# Patient Record
Sex: Male | Born: 1985 | Race: Black or African American | Hispanic: No | Marital: Single | Smoking: Never smoker
Health system: Southern US, Community
[De-identification: ages and names within clinical notes are randomized; demographics above are authoritative.]

## PROBLEM LIST (undated history)

## (undated) DIAGNOSIS — G43909 Migraine, unspecified, not intractable, without status migrainosus: Secondary | ICD-10-CM

## (undated) HISTORY — PX: TONSILLECTOMY: SUR1361

---
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
## 2007-05-05 ENCOUNTER — Emergency Department (HOSPITAL_COMMUNITY): Admission: EM | Admit: 2007-05-05 | Discharge: 2007-05-05 | Payer: Self-pay | Admitting: Emergency Medicine

## 2010-11-18 NOTE — H&P (Signed)
Clayton Heath, DENKER             ACCOUNT NO.:  [id number]   MEDICAL RECORD NO.:  [id number]          PATIENT TYPE:  AMB   LOCATION:  DAY                           FACILITY:  [facility]   PHYSICIAN:  Vickki Hearing, M.D.DATE OF BIRTH:  06/16/1966   DATE OF ADMISSION:  DATE OF DISCHARGE:  LH                              HISTORY & PHYSICAL   CHIEF COMPLAINT:  Torn right Achilles tendon.   HISTORY:  A 25 year old male with a history of testicular cancer,  treated successfully with right oophorectomy and chemotherapy, presents  status post left Achilles tendon repair now with a right Achilles tendon  repair.  He was injured on October 03, 2007.  He simply bent over to pick  up a ball and the tendon popped.  He heard a loud sound, pain, burning  in his right Achilles.  This was similar to his other injury for which  he underwent repair successfully.  He presents for surgical treatment of  the right Achilles tear.   He has no other medical problems.   No family history, except for a family history of cancer.   He is married.  He does smoke, drinks occasionally.  He has a high  school diploma.   REVIEW OF SYSTEMS:  Only positive for testicular cancer.   He has also had lumbar disk surgery.  He had the Achilles surgery as  stated.  He had an oophorectomy as stated.   PHYSICAL EXAMINATION:  VITAL SIGNS:  Stable.  Weight is 161 pounds.  APPEARANCE:  Normal.  CARDIOVASCULAR:  Intact.  SKIN:  Warm and dry with no rashes.  LYMPH NODES:  Normal.  PSYCH:  Normal.  He is awake, alert, and oriented x3.  NEUROLOGIC:  Normal.  MUSCULOSKELETAL:  Findings were a positive Thompson test for rupture of  the right Achilles tendon.  There is swelling there with palpable gap  and tenderness.  Passive range of motion is painful as well.  His other  extremities on inspection are normal.  There are no contractures.  No  instability.  Normal muscle tone is noted.   IMPRESSION:  Right Achilles tendon  rupture.   PLAN:  Right Achilles tendon repair.      Vickki Hearing, M.D.  Electronically Signed     SEH/MEDQ  D:  10/24/2007  T:  10/24/2007  Job:  [id number]   cc:   [facility]  Fax: [phone]

## 2011-04-11 LAB — POCT CARDIAC MARKERS
CKMB, poc: <1 — ABNORMAL LOW
CKMB, poc: <1 — ABNORMAL LOW
Myoglobin, poc: 47.9
Myoglobin, poc: 56
Operator id: 166561
Operator id: 166561
Troponin i, poc: <0.05
Troponin i, poc: <0.05

## 2011-12-05 ENCOUNTER — Encounter (HOSPITAL_COMMUNITY): Payer: Self-pay | Admitting: *Deleted

## 2011-12-05 ENCOUNTER — Emergency Department (HOSPITAL_COMMUNITY)
Admission: EM | Admit: 2011-12-05 | Discharge: 2011-12-05 | Disposition: A | Discharge status: Home / Self Care | Payer: BC Managed Care – PPO | Attending: Emergency Medicine | Admitting: Emergency Medicine

## 2011-12-05 DIAGNOSIS — S61411A Laceration without foreign body of right hand, initial encounter: Secondary | ICD-10-CM

## 2011-12-05 DIAGNOSIS — S61409A Unspecified open wound of unspecified hand, initial encounter: Secondary | ICD-10-CM | POA: Insufficient documentation

## 2011-12-05 DIAGNOSIS — W268XXA Contact with other sharp object(s), not elsewhere classified, initial encounter: Secondary | ICD-10-CM | POA: Insufficient documentation

## 2011-12-05 MED ORDER — HYDROCODONE-ACETAMINOPHEN 5-325 MG PO TABS: ORAL_TABLET | ORAL | Status: DC

## 2011-12-05 MED ORDER — TETANUS-DIPHTH-ACELL PERTUSSIS 5-2.5-18.5 LF-MCG/0.5 IM SUSP
0.5000 mL | Freq: Once | INTRAMUSCULAR | Status: AC
  Administered 2011-12-05: 0.5 mL via INTRAMUSCULAR
  Filled 2011-12-05: qty 0.5

## 2011-12-05 MED ORDER — LIDOCAINE HCL (PF) 1 % IJ SOLN
INTRAMUSCULAR | Status: AC
  Filled 2011-12-05: qty 5

## 2011-12-05 MED ORDER — TETANUS-DIPHTH-ACELL PERTUSSIS 5-2.5-18.5 LF-MCG/0.5 IM SUSP: 0.5000 mL | Freq: Once | INTRAMUSCULAR | Status: DC

## 2011-12-05 NOTE — ED Provider Notes (Signed)
Medical screening examination/treatment/procedure(s) were performed by non-physician practitioner and as supervising physician I was immediately available for consultation/collaboration.   Laray Anger, DO 12/05/11 1927

## 2011-12-05 NOTE — ED Notes (Signed)
Pt has 3 inch laceration to his right hand. States that he was opening a can and his hand slipped and he cut it on the lid. Bleeding controlled at present. Able to wiggle fingers and make a fist with no difficulty.

## 2011-12-05 NOTE — ED Notes (Signed)
Pt states that he is feeling better now. Remains diaphoretic but more responsive since being placed on stretcher lying down.

## 2011-12-05 NOTE — Discharge Instructions (Signed)
Sutured Wound Care Sutures are stitches that can be used to close wounds. Wound care helps prevent pain and infection.  HOME CARE INSTRUCTIONS   Rest and elevate the injured area until all the pain and swelling are gone.   Only take over-the-counter or prescription medicines for pain, discomfort, or fever as directed by your caregiver.   After 48 hours, gently wash the area with mild soap and water once a day, or as directed. Rinse off the soap. Pat the area dry with a clean towel. Do not rub the wound. This may cause bleeding.   Follow your caregiver's instructions for how often to change the bandage (dressing). Stop using a dressing after 2 days or after the wound stops draining.   If the dressing sticks, moisten it with soapy water and gently remove it.   Apply ointment on the wound as directed.   Avoid stretching a sutured wound.   Drink enough fluids to keep your urine clear or pale yellow.   Follow up with your caregiver for suture removal as directed.   Use sunscreen on your wound for the next 3 to 6 months so the scar will not darken.  SEEK IMMEDIATE MEDICAL CARE IF:   Your wound becomes red, swollen, hot, or tender.   You have increasing pain in the wound.   You have a red streak that extends from the wound.   There is pus coming from the wound.   You have a fever.   You have shaking chills.   There is a bad smell coming from the wound.   You have persistent bleeding from the wound.  MAKE SURE YOU:   Understand these instructions.   Will watch your condition.   Will get help right away if you are not doing well or get worse.  Document Released: 07/27/2004 Document Revised: 06/08/2011 Document Reviewed: 10/23/2010 [employer] Patient Information 2012 [employer].   Wash wound twice daily with soap and water then apply antibiotic ointment.  Suture removal in 8-10 days. 

## 2011-12-05 NOTE — ED Notes (Signed)
Reports last tetnus injection was in 2009.

## 2011-12-05 NOTE — ED Notes (Signed)
Pt extremely diaphoretic and feeling like he is going to pass out. Pt placed on stretcher.

## 2011-12-05 NOTE — ED Notes (Signed)
Unwrapped dressing - cleaned wound with wound cleanser.  Bleeding controlled.  Pt has approx 4cm in length.  nad noted.

## 2011-12-05 NOTE — ED Provider Notes (Signed)
History     CSN: [id number]  Arrival date & time 12/05/11  1320   First MD Initiated Contact with Patient 12/05/11 1423      Chief Complaint  Patient presents with  . Laceration    (Consider location/radiation/quality/duration/timing/severity/associated sxs/prior treatment) HPI Comments: Opening a can of green beans and cut R hand  Patient is a 26 y.o. male presenting with skin laceration. The history is provided by the patient. No language interpreter was used.  Laceration  The incident occurred 1 to 2 hours ago. The laceration is located on the right hand. Size: 4.5 cm. Injury mechanism: can lid. The pain is at a severity of 7/10. The pain has been constant since onset. He reports no foreign bodies present. His tetanus status is unknown.    History reviewed. No pertinent past medical history.  History reviewed. No pertinent past surgical history.  History reviewed. No pertinent family history.  History  Substance Use Topics  . Smoking status: Never Smoker   . Smokeless tobacco: Not on file  . Alcohol Use: No      Review of Systems  Skin: Positive for wound.  Neurological: Negative for weakness and numbness.  All other systems reviewed and are negative.    Allergies  Review of patient's allergies indicates no known allergies.  Home Medications   Current Outpatient Rx  Name Route Sig Dispense Refill  . B COMPLEX PO TABS Oral Take 1 tablet by mouth daily.    Marland Kitchen HYDROCODONE-ACETAMINOPHEN 5-325 MG PO TABS  One tab po q 4-6 hrs prn pain 20 tablet 0    BP 119/62  Pulse 73  Temp(Src) 98.1 F (36.7 C) (Oral)  Resp 18  Ht 5\' 6"  (1.676 m)  Wt 231 lb (104.781 kg)  BMI 37.28 kg/m2  SpO2 98%  Physical Exam  Nursing note and vitals reviewed. Constitutional: He is oriented to person, place, and time. He appears well-developed and well-nourished.  HENT:  Head: Normocephalic and atraumatic.  Eyes: EOM are normal.  Neck: Normal range of motion.  Cardiovascular:  Normal rate, regular rhythm, normal heart sounds and intact distal pulses.   Pulmonary/Chest: Effort normal and breath sounds normal. No respiratory distress.  Abdominal: Soft. He exhibits no distension. There is no tenderness.  Musculoskeletal: He exhibits tenderness.       Right hand: He exhibits decreased range of motion, tenderness and laceration. He exhibits no bony tenderness, normal capillary refill, no deformity and no swelling. normal sensation noted. Normal strength noted.       Hands: Neurological: He is alert and oriented to person, place, and time.  Skin: Skin is warm and dry.  Psychiatric: He has a normal mood and affect. Judgment normal.    ED Course  LACERATION REPAIR Date/Time: 12/05/2011 3:00 PM Performed by: Worthy Rancher Authorized by: Worthy Rancher Consent: Verbal consent obtained. Written consent not obtained. Risks and benefits: risks, benefits and alternatives were discussed Consent given by: patient Patient understanding: patient states understanding of the procedure being performed Site marked: the operative site was not marked Imaging studies: imaging studies not available Required items: required blood products, implants, devices, and special equipment available Patient identity confirmed: verbally with patient Time out: Immediately prior to procedure a "time out" was called to verify the correct patient, procedure, equipment, support staff and site/side marked as required. Body area: upper extremity Location details: right hand Laceration length: 4.5 cm Foreign bodies: no foreign bodies Tendon involvement: none Nerve involvement: none Vascular damage: no Anesthesia:  local infiltration Local anesthetic: lidocaine 1% without epinephrine Anesthetic total: 5 ml Patient sedated: no Preparation: Patient was prepped and draped in the usual sterile fashion. Irrigation solution: saline Irrigation method: syringe Amount of cleaning:  extensive Debridement: none Degree of undermining: none Skin closure: 4-0 nylon Number of sutures: 9 Technique: simple Approximation: close Approximation difficulty: simple Dressing: 4x4 sterile gauze Patient tolerance: Patient tolerated the procedure well with no immediate complications.   (including critical care time)  Labs Reviewed - No data to display No results found.   1. Hand laceration, right, initial encounter       MDM  Wash wound twice daily with soap and water.  Suture removal in 8-10 days.  DT given.        Worthy Rancher, PA 12/05/11 1537

## 2011-12-15 ENCOUNTER — Encounter (HOSPITAL_COMMUNITY): Payer: Self-pay | Admitting: *Deleted

## 2011-12-15 ENCOUNTER — Emergency Department (HOSPITAL_COMMUNITY)
Admission: EM | Admit: 2011-12-15 | Discharge: 2011-12-15 | Disposition: A | Discharge status: Home / Self Care | Payer: BC Managed Care – PPO | Attending: Emergency Medicine | Admitting: Emergency Medicine

## 2011-12-15 DIAGNOSIS — Z5189 Encounter for other specified aftercare: Secondary | ICD-10-CM

## 2011-12-15 NOTE — ED Notes (Signed)
Seen by Dr. Patria Mane and stitches removed in waiting area.

## 2011-12-15 NOTE — ED Provider Notes (Signed)
History   This chart was scribed for Lyanne Co, MD by Sofie Rower. The patient was seen in room APRP/RP and the patient's care was started at 1:17 PM    CSN: [id number]  Arrival date & time 12/15/11  1303   First MD Initiated Contact with Patient 12/15/11 1316      Chief Complaint  Patient presents with  . Wound Check    HPI  Clayton Heath is a 26 y.o. male who presents to the Emergency Department complaining of moderate, episodic wound check located on the right palm of right hand onset today. The pt states "he cut his hand last Tuesday and it is still sore." Pt has a hx of right hand laceration, 12/05/11, where which 9 sutures were applied to the right palm.   Pt denies fever, redness, hx of surgery.       History  Substance Use Topics  . Smoking status: Never Smoker   . Smokeless tobacco: Not on file  . Alcohol Use: No      Review of Systems  All other systems reviewed and are negative.    10 Systems reviewed and all are negative for acute change except as noted in the HPI.    Allergies  Review of patient's allergies indicates no known allergies.  Home Medications   Current Outpatient Rx  Name Route Sig Dispense Refill  . B COMPLEX PO TABS Oral Take 1 tablet by mouth daily.    Marland Kitchen HYDROCODONE-ACETAMINOPHEN 5-325 MG PO TABS  One tab po q 4-6 hrs prn pain 20 tablet 0    BP 133/77  Pulse 80  Temp 98 F (36.7 C)  Resp 20  Ht 5\' 6"  (1.676 m)  Wt 238 lb (107.956 kg)  BMI 38.41 kg/m2  SpO2 99%  Physical Exam  Nursing note and vitals reviewed. Constitutional: He is oriented to person, place, and time. He appears well-developed and well-nourished.  HENT:  Head: Atraumatic.  Right Ear: External ear normal.  Left Ear: External ear normal.  Nose: Nose normal.  Eyes: EOM are normal. Pupils are equal, round, and reactive to light. Right eye exhibits no discharge. Left eye exhibits no discharge.  Neck: Normal range of motion.  Cardiovascular: Normal  rate.   Pulmonary/Chest: Effort normal.  Musculoskeletal: Normal range of motion.       Suture line, no erythema, no fluctuance, intact right radial surface of palm, normal flexion of right thumb.   Neurological: He is alert and oriented to person, place, and time.  Skin: Skin is warm and dry. No erythema.    ED Course  Procedures (including critical care time)  DIAGNOSTIC STUDIES: Oxygen Saturation is 99% on room air, normal by my interpretation.    COORDINATION OF CARE:    1:21PM- EDP at bedside removes 3 out of 9 stitches, wound began to dehisce.  1:22PM- EDP at bedside discusses treatment plan concerning leaving sutures in for a while longer (4 more days, return on 12/19/11) .    Labs Reviewed - No data to display No results found.   1. Visit for wound check       MDM  When I attempted to remove the sutures at began to be a small amount of wound dehiscent.  3 sutures were removed.  Is obvious at this time the sutures need to be on for longer.  The patient will return in 4 days.  No signs of infection.  Steri-Strips applied      I personally  performed the services described in this documentation, which was scribed in my presence. The recorded information has been reviewed and considered.         Lyanne Co, MD 12/15/11 1330

## 2011-12-15 NOTE — ED Notes (Signed)
Here for suture removal to rt hand. Wound checked by Md at triage.  Alert, NAD

## 2012-07-20 ENCOUNTER — Emergency Department (HOSPITAL_COMMUNITY)
Admission: EM | Admit: 2012-07-20 | Discharge: 2012-07-20 | Disposition: A | Discharge status: Home / Self Care | Payer: BC Managed Care – PPO | Attending: Emergency Medicine | Admitting: Emergency Medicine

## 2012-07-20 ENCOUNTER — Encounter (HOSPITAL_COMMUNITY): Payer: Self-pay | Admitting: *Deleted

## 2012-07-20 DIAGNOSIS — Y929 Unspecified place or not applicable: Secondary | ICD-10-CM | POA: Insufficient documentation

## 2012-07-20 DIAGNOSIS — Y939 Activity, unspecified: Secondary | ICD-10-CM | POA: Insufficient documentation

## 2012-07-20 DIAGNOSIS — W268XXA Contact with other sharp object(s), not elsewhere classified, initial encounter: Secondary | ICD-10-CM | POA: Insufficient documentation

## 2012-07-20 DIAGNOSIS — S61209A Unspecified open wound of unspecified finger without damage to nail, initial encounter: Secondary | ICD-10-CM | POA: Insufficient documentation

## 2012-07-20 DIAGNOSIS — S61219A Laceration without foreign body of unspecified finger without damage to nail, initial encounter: Secondary | ICD-10-CM

## 2012-07-20 MED ORDER — LIDOCAINE HCL (PF) 1 % IJ SOLN
INTRAMUSCULAR | Status: AC
  Filled 2012-07-20: qty 5

## 2012-07-20 NOTE — ED Notes (Signed)
Laceration to 4th digit of right hand. Cut by a piece of glass. NAD. Tetanus is UTD

## 2012-07-20 NOTE — ED Provider Notes (Signed)
History     CSN: [id number]  Arrival date & time 07/20/12  1650   First MD Initiated Contact with Patient 07/20/12 1710      Chief Complaint  Patient presents with  . Extremity Laceration    (Consider location/radiation/quality/duration/timing/severity/associated sxs/prior treatment) Patient is a 27 y.o. Heath presenting with skin laceration. The history is provided by the patient.  Laceration  The incident occurred 1 to 2 hours ago. The laceration is located on the right hand. The laceration is 2 cm in size. The laceration mechanism was a broken glass. The pain is moderate. He reports no foreign bodies present. His tetanus status is UTD.    History reviewed. No pertinent past medical history.  Past Surgical History  Procedure Date  . Tonsillectomy     No family history on file.  History  Substance Use Topics  . Smoking status: Never Smoker   . Smokeless tobacco: Not on file  . Alcohol Use: No      Review of Systems  Constitutional: Negative for activity change.       All ROS Neg except as noted in HPI  HENT: Negative for nosebleeds and neck pain.   Eyes: Negative for photophobia and discharge.  Respiratory: Negative for cough, shortness of breath and wheezing.   Cardiovascular: Negative for chest pain and palpitations.  Gastrointestinal: Negative for abdominal pain and blood in stool.  Genitourinary: Negative for dysuria, frequency and hematuria.  Musculoskeletal: Negative for back pain and arthralgias.  Skin: Negative.   Neurological: Negative for dizziness, seizures and speech difficulty.  Psychiatric/Behavioral: Negative for hallucinations and confusion.    Allergies  Review of patient's allergies indicates no known allergies.  Home Medications   Current Outpatient Rx  Name  Route  Sig  Dispense  Refill  . B COMPLEX PO TABS   Oral   Take 1 tablet by mouth daily.         Marland Kitchen HYDROCODONE-ACETAMINOPHEN 5-325 MG PO TABS      One tab po q 4-6 hrs prn  pain   20 tablet   0     BP 134/61  Pulse 69  Temp 98.1 F (36.7 C) (Oral)  Resp 16  Ht 5\' 6"  (1.676 m)  Wt 228 lb (103.42 kg)  BMI 36.80 kg/m2  SpO2 100%  Physical Exam  Nursing note and vitals reviewed. Constitutional: He is oriented to person, place, and time. He appears well-developed and well-nourished.  Non-toxic appearance.  HENT:  Head: Normocephalic.  Right Ear: Tympanic membrane and external ear normal.  Left Ear: Tympanic membrane and external ear normal.  Eyes: EOM and lids are normal. Pupils are equal, round, and reactive to light.  Neck: Normal range of motion. Neck supple. Carotid bruit is not present.  Cardiovascular: Normal rate, regular rhythm, normal heart sounds, intact distal pulses and normal pulses.   Pulmonary/Chest: Breath sounds normal. No respiratory distress.  Abdominal: Soft. Bowel sounds are normal. There is no tenderness. There is no guarding.  Musculoskeletal: Normal range of motion.       There is a 2.1 cm laceration of the lateral aspect of the right fourth finger. No bone or tendon involvement. Is full range of motion of all fingers of the right hand. Sensory is intact.  Lymphadenopathy:       Head (right side): No submandibular adenopathy present.       Head (left side): No submandibular adenopathy present.    He has no cervical adenopathy.  Neurological: He is alert  and oriented to person, place, and time. He has normal strength. No cranial nerve deficit or sensory deficit. Coordination normal.       No sensory or motor deficits of the right hand.  Skin: Skin is warm and dry.  Psychiatric: He has a normal mood and affect. His speech is normal.    ED Course  LACERATION REPAIR Date/Time: 07/20/2012 5:40 PM Performed by: Kathie Dike Authorized by: Kathie Dike Consent: Verbal consent obtained. Risks and benefits: risks, benefits and alternatives were discussed Consent given by: patient Patient understanding: patient states  understanding of the procedure being performed Patient identity confirmed: arm band Time out: Immediately prior to procedure a "time out" was called to verify the correct patient, procedure, equipment, support staff and site/side marked as required. Body area: upper extremity Location details: right ring finger Laceration length: 2.1 cm Foreign bodies: no foreign bodies Vascular damage: no Preparation: Patient was prepped and draped in the usual sterile fashion. Irrigation solution: tap water Irrigation method: tap Amount of cleaning: standard (safe clens) Skin closure: glue Approximation: close Approximation difficulty: simple Patient tolerance: Patient tolerated the procedure well with no immediate complications.   (including critical care time)  Labs Reviewed - No data to display No results found.   No diagnosis found.    MDM  I have reviewed nursing notes, vital signs, and all appropriate lab and imaging results for this patient. Patient sustained a laceration to the right fourth finger on a piece of glass. The patient's tetanus is up-to-date. There is no tendon or nerve or vascular involvement. The wound was repaired with Dermabond without problem or complication. The patient has been given instructions on Dermabond and instructions on signs of infection. The patient is to return if any changes or problems.      Kathie Dike, [place] 07/20/12 1743

## 2012-07-20 NOTE — ED Provider Notes (Signed)
Medical screening examination/treatment/procedure(s) were performed by non-physician practitioner and as supervising physician I was immediately available for consultation/collaboration.  Joretta Eads, MD 07/20/12 2240 

## 2013-08-25 ENCOUNTER — Encounter (HOSPITAL_COMMUNITY): Payer: Self-pay | Admitting: Emergency Medicine

## 2013-08-25 ENCOUNTER — Emergency Department (HOSPITAL_COMMUNITY)
Admission: EM | Admit: 2013-08-25 | Discharge: 2013-08-25 | Disposition: A | Discharge status: Home / Self Care | Payer: BC Managed Care – PPO | Attending: Emergency Medicine | Admitting: Emergency Medicine

## 2013-08-25 DIAGNOSIS — R51 Headache: Secondary | ICD-10-CM | POA: Insufficient documentation

## 2013-08-25 DIAGNOSIS — R519 Headache, unspecified: Secondary | ICD-10-CM

## 2013-08-25 MED ORDER — METOCLOPRAMIDE HCL 5 MG/ML IJ SOLN
10.0000 mg | Freq: Once | INTRAMUSCULAR | Status: AC
  Administered 2013-08-25: 10 mg via INTRAVENOUS
  Filled 2013-08-25: qty 2

## 2013-08-25 MED ORDER — DIPHENHYDRAMINE HCL 50 MG/ML IJ SOLN
25.0000 mg | Freq: Once | INTRAMUSCULAR | Status: AC
  Administered 2013-08-25: 25 mg via INTRAVENOUS
  Filled 2013-08-25: qty 1

## 2013-08-25 NOTE — Discharge Instructions (Signed)

## 2013-08-25 NOTE — ED Notes (Signed)
Pt c/o ha today with nausea/dizziness. Pt mother states pt has been having bad headaches recently.

## 2013-08-25 NOTE — ED Notes (Signed)
Patient with no complaints at this time. Respirations even and unlabored. Skin warm/dry. Discharge instructions reviewed with patient at this time. Patient given opportunity to voice concerns/ask questions. IV removed per policy and band-aid applied to site. Patient discharged at this time and left Emergency Department with steady gait.  

## 2013-08-25 NOTE — ED Provider Notes (Signed)
CSN: [id number]     Arrival date & time 08/25/13  1523 History   This chart was scribed for Clayton Gaskins, MD by Manuela Schwartz, ED scribe. This patient was seen in room APA19/APA19 and the patient's care was started at 4:45 PM .    Chief Complaint  Patient presents with  . Headache      HPI HPI Comments: Clayton Heath is a 28 y.o. male who presents to the Emergency Department complaining of moderate to severe HA beginning approximately 3.5 hours ago gradually worsening over ten minutes. Pt reports the HA similar to one he experienced a week ago. He rates the maximum pain he felt during the HA as 10/10, and he currently describes the pain as 8/10. Pt heard a "slight buzzing" during the HA. Pt denies nausea, vomiting, vision loss, weakness in arms and legs, and fever. He also denies h/o strokes, trauma, syncope, CO exposure. He also reports his mother has a h/o migraines.    PMH - none  Past Surgical History  Procedure Laterality Date  . Tonsillectomy     No family history on file. History  Substance Use Topics  . Smoking status: Never Smoker   . Smokeless tobacco: Not on file  . Alcohol Use: No    Review of Systems  Constitutional: Negative for fever and chills.  Eyes: Negative for visual disturbance.  Respiratory: Negative for cough and shortness of breath.   Cardiovascular: Negative for chest pain.  Gastrointestinal: Negative for nausea, vomiting and abdominal pain.  Musculoskeletal: Negative for back pain.  Neurological: Positive for headaches. Negative for weakness.  All other systems reviewed and are negative.      Allergies  Review of patient's allergies indicates no known allergies.  Home Medications   Current Outpatient Rx  Name  Route  Sig  Dispense  Refill  . b complex vitamins tablet   Oral   Take 1 tablet by mouth daily.          Triage Vitals: BP 143/75  Pulse 79  Temp(Src) 98.3 F (36.8 C) (Oral)  Resp 18  Ht 5\' 7"  (1.702 m)  Wt 224 lb  (101.606 kg)  BMI 35.08 kg/m2  SpO2 100%  Physical Exam CONSTITUTIONAL: Well developed/well nourished HEAD: Normocephalic/atraumatic EYES: EOMI/PERRL, no nystagmus ENMT: Mucous membranes moist NECK: supple no meningeal signs, no bruits SPINE:entire spine nontender CV: S1/S2 noted, no murmurs/rubs/gallops noted LUNGS: Lungs are clear to auscultation bilaterally, no apparent distress ABDOMEN: soft, nontender, no rebound or guarding GU:no cva tenderness NEURO:Awake/alert, facies symmetric, no arm or leg drift is noted Cranial nerves 3/4/5/6/01/08/09/11/12 tested and intact Gait normal without ataxia No past pointing No focal sensory deficits in his arms/legs at this time EXTREMITIES: pulses normal, full ROM SKIN: warm, color normal PSYCH: no abnormalities of mood noted    ED Course  Procedures   DIAGNOSTIC STUDIES: Oxygen Saturation is 100% on room air, normal by my interpretation.    COORDINATION OF CARE: At 4:49 PM Discussed treatment plan with patient which includes IV fluids and pain medication. Patient agrees.    Pt improved, well appearing, no distress, no focal neuro deficits, low suspicion for [facility] or other acute neurologic event (resting comfortably in the room) We discussed strict return precauitons  MDM   Final diagnoses:  Headache    Nursing notes including past medical history and social history reviewed and considered in documentation  I personally performed the services described in this documentation, which was scribed in my presence. The  recorded information has been reviewed and is accurate.     Clayton Gaskinsonald W Josepha Barbier, MD 08/25/13 [phone]

## 2013-12-10 ENCOUNTER — Ambulatory Visit (HOSPITAL_BASED_OUTPATIENT_CLINIC_OR_DEPARTMENT_OTHER): Payer: BC Managed Care – PPO | Attending: Otolaryngology | Admitting: Sleep Medicine

## 2013-12-10 VITALS — Ht 66.0 in | Wt 232.0 lb

## 2013-12-10 DIAGNOSIS — R0609 Other forms of dyspnea: Secondary | ICD-10-CM | POA: Insufficient documentation

## 2013-12-10 DIAGNOSIS — G4733 Obstructive sleep apnea (adult) (pediatric): Secondary | ICD-10-CM

## 2013-12-10 DIAGNOSIS — R0989 Other specified symptoms and signs involving the circulatory and respiratory systems: Secondary | ICD-10-CM | POA: Insufficient documentation

## 2013-12-14 DIAGNOSIS — G4733 Obstructive sleep apnea (adult) (pediatric): Secondary | ICD-10-CM

## 2013-12-14 NOTE — Sleep Study (Signed)
   NAME: Clayton Heath DATE OF BIRTH:  Dec 02, 1985 MEDICAL RECORD NUMBER [id number]  LOCATION: [facility]  PHYSICIAN: Harlem Thresher D  DATE OF STUDY: 12/10/2013  SLEEP STUDY TYPE: Nocturnal Polysomnogram               REFERRING PHYSICIAN: Suzanna ObeyByers, John, MD  INDICATION FOR STUDY: Hypersomnia with sleep apnea  EPWORTH SLEEPINESS SCORE:   9/24 HEIGHT: 5\' 6"  (167.6 cm)  WEIGHT: 105.235 kg (232 lb)    Body mass index is 37.46 kg/(m^2).  NECK SIZE:   16.5 in.  MEDICATIONS: Charted for review  SLEEP ARCHITECTURE: Total sleep time 285.5 minutes with sleep efficiency 72.1%. Stage I was 3%, stage II 51.5%, stage III 19.3%, REM 26.3% of total sleep time. Sleep latency 68 minutes, REM latency 52.5 minutes, awake after sleep onset 16.5 minutes, arousal index 12, bedtime medication: None.  RESPIRATORY DATA: Apnea hypopnea index (AHI) 7.8 per hour. 37 total events scored including 2 obstructive apneas and 35 hypopneas. All events were associated with supine sleep position. REM AHI 24.8 per hour. There were not enough  early events to permit application of split protocol CPAP titration.  OXYGEN DATA: Loud snoring with oxygen desaturation nadir of 87% and mean oxygen saturation through the study of 95.3% on room air.  CARDIAC DATA: Normal sinus rhythm  MOVEMENT/PARASOMNIA: A few incidental limb jerks were noted with little effect on sleep. No bathroom trips.  IMPRESSION/ RECOMMENDATION:   1) Mild obstructive sleep apnea/hypopnea syndrome, AHI 7.8 per hour with supine events. Loud snoring with oxygen desaturation to a nadir of 87% and mean oxygen saturation through the study of 95.3% on room air. 2) There were not enough early events to meet protocol requirements for protocol CPAP titration on this study 3) Incidental observation-the patient reported a bitter taste in his mouth upon awakening. Consider possibility of reflux.  Signed Jetty Duhamellinton Darcy Barbara M.D. Waymon BudgeYOUNG,Americus Perkey  D Diplomate, American Board of Sleep Medicine  ELECTRONICALLY SIGNED ON:  12/14/2013, 8:22 AM [facility] PH: [phone]   FX: [phone] ACCREDITED BY THE AMERICAN ACADEMY OF SLEEP MEDICINE

## 2014-01-06 ENCOUNTER — Ambulatory Visit (HOSPITAL_BASED_OUTPATIENT_CLINIC_OR_DEPARTMENT_OTHER): Payer: BC Managed Care – PPO

## 2014-05-10 ENCOUNTER — Encounter (HOSPITAL_COMMUNITY): Payer: Self-pay | Admitting: Emergency Medicine

## 2014-05-10 ENCOUNTER — Emergency Department (HOSPITAL_COMMUNITY)
Admission: EM | Admit: 2014-05-10 | Discharge: 2014-05-10 | Disposition: A | Discharge status: Home / Self Care | Payer: BC Managed Care – PPO | Attending: Emergency Medicine | Admitting: Emergency Medicine

## 2014-05-10 DIAGNOSIS — G43009 Migraine without aura, not intractable, without status migrainosus: Secondary | ICD-10-CM | POA: Insufficient documentation

## 2014-05-10 DIAGNOSIS — Z79899 Other long term (current) drug therapy: Secondary | ICD-10-CM | POA: Insufficient documentation

## 2014-05-10 HISTORY — DX: Migraine, unspecified, not intractable, without status migrainosus: G43.909

## 2014-05-10 MED ORDER — SODIUM CHLORIDE 0.9 % IV BOLUS (SEPSIS)
500.0000 mL | Freq: Once | INTRAVENOUS | Status: AC
  Administered 2014-05-10: 20:00:00 via INTRAVENOUS

## 2014-05-10 MED ORDER — DIPHENHYDRAMINE HCL 50 MG/ML IJ SOLN
25.0000 mg | Freq: Once | INTRAMUSCULAR | Status: AC
  Administered 2014-05-10: 25 mg via INTRAVENOUS
  Filled 2014-05-10: qty 1

## 2014-05-10 MED ORDER — METOCLOPRAMIDE HCL 5 MG/ML IJ SOLN
10.0000 mg | Freq: Once | INTRAMUSCULAR | Status: AC
  Administered 2014-05-10: 10 mg via INTRAVENOUS
  Filled 2014-05-10: qty 2

## 2014-05-10 NOTE — ED Provider Notes (Signed)
CSN: [id number]     Arrival date & time 05/10/14  1716 History  This chart was scribed for American Expressathan R. Rubin PayorPickering, MD by Evon Slackerrance Branch, ED Scribe. This patient was seen in room APA06/APA06 and the patient's care was started at 6:20 PM.      Chief Complaint  Patient presents with  . Migraine   The history is provided by the patient. No language interpreter was used.   HPI Comments: Tommi EmeryLevaniel K Teem is a 28 y.o. male with PMHx of migraines who presents to the Emergency Department complaining of gradual onset headache that began 1 day ago. He states he has associated photophobia, sound sensitivity, sleep disturbance and nausea. He states that the last time her was in the ED he received a headache cocktail that provided relief. Denies head injury or trauma. Denies any visual disturbance, numbness or weakness.     Past Medical History  Diagnosis Date  . Migraines    Past Surgical History  Procedure Laterality Date  . Tonsillectomy     History reviewed. No pertinent family history. History  Substance Use Topics  . Smoking status: Never Smoker   . Smokeless tobacco: Not on file  . Alcohol Use: No    Review of Systems  Eyes: Positive for photophobia. Negative for visual disturbance.  Gastrointestinal: Positive for nausea.  Neurological: Positive for headaches. Negative for weakness and numbness.  Psychiatric/Behavioral: Positive for sleep disturbance.    Allergies  Review of patient's allergies indicates no known allergies.  Home Medications   Prior to Admission medications   Medication Sig Start Date End Date Taking? Authorizing Provider  Multiple Vitamin (MULTIVITAMIN WITH MINERALS) TABS tablet Take 1 tablet by mouth daily.   Yes Historical Provider, MD   Triage Vitals: BP 137/72 mmHg  Pulse 63  Temp(Src) 98.8 F (37.1 C) (Oral)  Resp 15  Ht 5\' 6"  (1.676 m)  Wt 224 lb (101.606 kg)  BMI 36.17 kg/m2  SpO2 100%  Physical Exam  Constitutional: He is oriented to person,  place, and time. He appears well-developed and well-nourished. No distress.  HENT:  Head: Normocephalic and atraumatic.  Eyes: Conjunctivae and EOM are normal.  Neck: Neck supple. No tracheal deviation present.  Cardiovascular: Normal rate.   Pulmonary/Chest: Effort normal. No respiratory distress.  Musculoskeletal: Normal range of motion.  Neurological: He is alert and oriented to person, place, and time.  Skin: Skin is warm and dry.  Psychiatric: He has a normal mood and affect. His behavior is normal.  Nursing note and vitals reviewed.   ED Course  Procedures (including critical care time) Labs Review Labs Reviewed - No data to display  Imaging Review No results found.   EKG Interpretation None      MDM   Final diagnoses:  Migraine without aura and without status migrainosus, not intractable    patient with headache. History of same. History of migraines.feels better after treatment will be discharged home.   I personally performed the services described in this documentation, which was scribed in my presence. The recorded information has been reviewed and is accurate.       Juliet RudeNathan R. Rubin PayorPickering, MD 05/11/14 [id number]

## 2014-05-10 NOTE — Discharge Instructions (Signed)

## 2014-05-10 NOTE — ED Notes (Signed)
Pt reports headache onset last night at 11pm. Pt reports history of migraines. Pt reports light and sound sensitivity and nausea. nad noted.

## 2014-06-27 ENCOUNTER — Emergency Department (HOSPITAL_COMMUNITY)
Admission: EM | Admit: 2014-06-27 | Discharge: 2014-06-28 | Disposition: A | Discharge status: Home / Self Care | Payer: BC Managed Care – PPO | Attending: Emergency Medicine | Admitting: Emergency Medicine

## 2014-06-27 ENCOUNTER — Emergency Department (HOSPITAL_COMMUNITY): Payer: BC Managed Care – PPO

## 2014-06-27 ENCOUNTER — Encounter (HOSPITAL_COMMUNITY): Payer: Self-pay | Admitting: Emergency Medicine

## 2014-06-27 DIAGNOSIS — R05 Cough: Secondary | ICD-10-CM

## 2014-06-27 DIAGNOSIS — R059 Cough, unspecified: Secondary | ICD-10-CM

## 2014-06-27 DIAGNOSIS — Z8679 Personal history of other diseases of the circulatory system: Secondary | ICD-10-CM | POA: Insufficient documentation

## 2014-06-27 DIAGNOSIS — J40 Bronchitis, not specified as acute or chronic: Secondary | ICD-10-CM | POA: Insufficient documentation

## 2014-06-27 NOTE — ED Provider Notes (Signed)
CSN: [id number]     Arrival date & time 06/27/14  2129 History  This chart was scribed for Loren Raceravid Calliope Delangel, MD by Roxy Cedarhandni Bhalodia, ED Scribe. This patient was seen in room APA19/APA19 and the patient's care was started at 11:23 PM.   Chief Complaint  Patient presents with  . Cough   Patient is a 28 y.o. male presenting with cough. The history is provided by the patient. No language interpreter was used.  Cough Cough characteristics:  Productive Sputum characteristics:  Yellow Severity:  Moderate Timing:  Constant Progression:  Waxing and waning Chronicity:  New Associated symptoms: chills and sore throat   Associated symptoms: no chest pain, no headaches, no rash and no shortness of breath    HPI Comments: Clayton Heath is a 28 y.o. male who presents to the Emergency Department complaining of moderate productive cough with yellow sputum that began 3 days ago. He reports associated fever that lasted for 2 days, but states that he currently does not have a fever. He also reports associated chills and sore throat. Patient states that he had sick contact with a relative that had the flu and one with pneumonia. He states he has taken Muccinex that has provided some relief. No difficulty breathing. No chest pain.  Past Medical History  Diagnosis Date  . Migraines    Past Surgical History  Procedure Laterality Date  . Tonsillectomy     History reviewed. No pertinent family history. History  Substance Use Topics  . Smoking status: Never Smoker   . Smokeless tobacco: Not on file  . Alcohol Use: No   Review of Systems  Constitutional: Positive for chills.  HENT: Positive for congestion and sore throat. Negative for sinus pressure.   Respiratory: Positive for cough. Negative for shortness of breath.   Cardiovascular: Negative for chest pain.  Gastrointestinal: Negative for nausea, vomiting, abdominal pain and diarrhea.  Musculoskeletal: Negative for back pain, neck pain and neck  stiffness.  Skin: Negative for rash and wound.  Neurological: Negative for dizziness, weakness, light-headedness, numbness and headaches.  All other systems reviewed and are negative.  Allergies  Review of patient's allergies indicates no known allergies.  Home Medications   Prior to Admission medications   Medication Sig Start Date End Date Taking? Authorizing Provider  benzonatate (TESSALON) 100 MG capsule Take 1 capsule (100 mg total) by mouth every 8 (eight) hours. 06/28/14   Loren Raceravid Paraskevi Funez, MD  ibuprofen (ADVIL,MOTRIN) 600 MG tablet Take 1 tablet (600 mg total) by mouth every 6 (six) hours as needed. 06/28/14   Loren Raceravid Sharnise Blough, MD  Multiple Vitamin (MULTIVITAMIN WITH MINERALS) TABS tablet Take 1 tablet by mouth daily.    Historical Provider, MD   Triage Vitals; BP 123/94 mmHg  Pulse 80  Temp(Src) 98.7 F (37.1 C)  Resp 16  Ht 5\' 6"  (1.676 m)  Wt 225 lb (102.059 kg)  BMI 36.33 kg/m2  SpO2 100%  Physical Exam  Constitutional: He is oriented to person, place, and time. He appears well-developed and well-nourished. No distress.  HENT:  Head: Normocephalic and atraumatic.  Mouth/Throat: No oropharyngeal exudate.  Oropharynx is mildly erythematous. No tonsillar exudates.  Eyes: Conjunctivae and EOM are normal.  Neck: Neck supple. No tracheal deviation present.  Cardiovascular: Normal rate and regular rhythm.  Exam reveals no gallop and no friction rub.   No murmur heard. Pulmonary/Chest: Effort normal. No respiratory distress. He has no wheezes. He has no rales. He exhibits no tenderness.  Abdominal: Soft. Bowel sounds  are normal. He exhibits no distension and no mass. There is no tenderness. There is no rebound and no guarding.  Musculoskeletal: Normal range of motion. He exhibits no edema or tenderness.   No calf swelling or tenderness.  Neurological: He is alert and oriented to person, place, and time.  Moves all extremities without deficit. Sensation is grossly intact.   Skin: Skin is warm and dry.  Psychiatric: He has a normal mood and affect. His behavior is normal.  Nursing note and vitals reviewed.  ED Course  Procedures (including critical care time)  DIAGNOSTIC STUDIES: Oxygen Saturation is 100% on RA, normal by my interpretation.    COORDINATION OF CARE: 11:24 PM- Discussed plans to obtain diagnostic CXR. Pt advised of plan for treatment and pt agrees.  Labs Review Labs Reviewed - No data to display  Imaging Review Dg Chest 2 View  06/28/2014   CLINICAL DATA:  Cough, rib pain  EXAM: CHEST  2 VIEW  COMPARISON:  05/05/2007  FINDINGS: Normal mediastinum and cardiac silhouette. Normal pulmonary vasculature. No evidence of effusion, infiltrate, or pneumothorax. No acute bony abnormality.  IMPRESSION: Normal chest radiograph.   Electronically Signed   By: Genevive BiStewart  Edmunds M.D.   On: 06/28/2014 00:32     EKG Interpretation None     MDM   Final diagnoses:  Cough  Bronchitis      I personally performed the services described in this documentation, which was scribed in my presence. The recorded information has been reviewed and is accurate.  Patient very well-appearing. Normal vital signs. Lungs are clear. Chest x-ray to rule out pneumonia. Doubt influenza.  No evidence of pneumonia on chest x-ray. We'll treat symptomatically. Patient advised to follow-up with his primary doctor for persistent symptoms. Return precautions given.  Loren Raceravid Landis Cassaro, MD 06/28/14 [phone]

## 2014-06-27 NOTE — ED Notes (Signed)
Patient reports cough that started Thursday. Denies fever. States felt short of breath earlier. Reports productive cough.

## 2014-06-28 MED ORDER — BENZONATATE 100 MG PO CAPS: 100.0000 mg | ORAL_CAPSULE | Freq: Three times a day (TID) | ORAL | Status: AC

## 2014-06-28 MED ORDER — BENZONATATE 100 MG PO CAPS
100.0000 mg | ORAL_CAPSULE | Freq: Once | ORAL | Status: AC
  Administered 2014-06-28: 100 mg via ORAL
  Filled 2014-06-28: qty 1

## 2014-06-28 MED ORDER — IBUPROFEN 400 MG PO TABS
600.0000 mg | ORAL_TABLET | Freq: Once | ORAL | Status: AC
  Administered 2014-06-28: 600 mg via ORAL
  Filled 2014-06-28: qty 2

## 2014-06-28 MED ORDER — IBUPROFEN 600 MG PO TABS: 600.0000 mg | ORAL_TABLET | Freq: Four times a day (QID) | ORAL | Status: AC | PRN

## 2014-06-28 NOTE — ED Notes (Signed)
Pt alert & oriented x4, stable gait. Patient given discharge instructions, paperwork & prescription(s). Patient  instructed to stop at the registration desk to finish any additional paperwork. Patient verbalized understanding. Pt left department w/ no further questions. 

## 2014-06-28 NOTE — Discharge Instructions (Signed)

## 2014-09-28 ENCOUNTER — Encounter (HOSPITAL_COMMUNITY): Payer: Self-pay | Admitting: Emergency Medicine

## 2014-09-28 ENCOUNTER — Emergency Department (HOSPITAL_COMMUNITY)
Admission: EM | Admit: 2014-09-28 | Discharge: 2014-09-28 | Disposition: A | Discharge status: Home / Self Care | Payer: BC Managed Care – PPO | Attending: Emergency Medicine | Admitting: Emergency Medicine

## 2014-09-28 DIAGNOSIS — J029 Acute pharyngitis, unspecified: Secondary | ICD-10-CM | POA: Insufficient documentation

## 2014-09-28 DIAGNOSIS — Z8679 Personal history of other diseases of the circulatory system: Secondary | ICD-10-CM | POA: Insufficient documentation

## 2014-09-28 LAB — RAPID STREP SCREEN (MED CTR MEBANE ONLY): Streptococcus, Group A Screen (Direct): NEGATIVE

## 2014-09-28 MED ORDER — DEXAMETHASONE 4 MG PO TABS
12.0000 mg | ORAL_TABLET | Freq: Once | ORAL | Status: AC
  Administered 2014-09-28: 12 mg via ORAL
  Filled 2014-09-28: qty 3

## 2014-09-28 MED ORDER — IBUPROFEN 400 MG PO TABS
600.0000 mg | ORAL_TABLET | Freq: Once | ORAL | Status: AC
  Administered 2014-09-28: 600 mg via ORAL
  Filled 2014-09-28: qty 2

## 2014-09-28 MED ORDER — OXYCODONE-ACETAMINOPHEN 5-325 MG PO TABS
2.0000 | ORAL_TABLET | Freq: Once | ORAL | Status: AC
  Administered 2014-09-28: 2 via ORAL
  Filled 2014-09-28: qty 2

## 2014-09-28 MED ORDER — TRAMADOL HCL 50 MG PO TABS
50.0000 mg | ORAL_TABLET | Freq: Four times a day (QID) | ORAL | Status: AC | PRN
Start: 2014-09-28 — End: ?

## 2014-09-28 NOTE — ED Provider Notes (Signed)
CSN: [id number]     Arrival date & time 09/28/14  4098 History  This chart was scribed for Raeford Razor, MD by Tonye Royalty, ED Scribe. This patient was seen in room APA12/APA12 and the patient's care was started at 10:22 AM.    Chief Complaint  Patient presents with  . Sore Throat   The history is provided by the patient. No language interpreter was used.    HPI Comments: Clayton Heath is a 29 y.o. male who presents to the Emergency Department complaining of sore throat with onset yesterday. He reports associated chills and cough. He states he used Excedrin for a headache without improvement to his sore throat. He reports his mother has with strep throat. He denies fever.  Past Medical History  Diagnosis Date  . Migraines    Past Surgical History  Procedure Laterality Date  . Tonsillectomy     History reviewed. No pertinent family history. History  Substance Use Topics  . Smoking status: Never Smoker   . Smokeless tobacco: Not on file  . Alcohol Use: No    Review of Systems  Constitutional: Positive for chills. Negative for fever.  HENT: Positive for sore throat.   Respiratory: Positive for cough.   All other systems reviewed and are negative.     Allergies  Review of patient's allergies indicates no known allergies.  Home Medications   Prior to Admission medications   Medication Sig Start Date End Date Taking? Authorizing Provider  aspirin-acetaminophen-caffeine (EXCEDRIN MIGRAINE) [phone] MG per tablet Take 2 tablets by mouth every 6 (six) hours as needed for headache.   Yes Historical Provider, MD  benzonatate (TESSALON) 100 MG capsule Take 1 capsule (100 mg total) by mouth every 8 (eight) hours. Patient not taking: Reported on 09/28/2014 06/28/14   Loren Racer, MD  ibuprofen (ADVIL,MOTRIN) 600 MG tablet Take 1 tablet (600 mg total) by mouth every 6 (six) hours as needed. Patient not taking: Reported on 09/28/2014 06/28/14   Loren Racer, MD   BP  110/70 mmHg  Pulse 83  Temp(Src) 98.8 F (37.1 C) (Oral)  Resp 18  Ht  (1.676 m)  Wt 217 lb (98.431 kg)  BMI 35.04 kg/m2  SpO2 98% Physical Exam  Constitutional: He appears well-developed and well-nourished. No distress.  HENT:  Head: Normocephalic and atraumatic.  Mouth/Throat: Uvula is midline. Posterior oropharyngeal erythema present. No oropharyngeal exudate.  Eyes: Conjunctivae are normal. Right eye exhibits no discharge. Left eye exhibits no discharge.  Neck: Neck supple.  Cardiovascular: Normal rate, regular rhythm and normal heart sounds.  Exam reveals no gallop and no friction rub.   No murmur heard. Pulmonary/Chest: Effort normal and breath sounds normal. No respiratory distress.  Abdominal: Soft. He exhibits no distension. There is no tenderness.  Musculoskeletal: He exhibits no edema or tenderness.  Lymphadenopathy:    He has no cervical adenopathy.  Neurological: He is alert.  Skin: Skin is warm and dry.  Psychiatric: He has a normal mood and affect. His behavior is normal. Thought content normal.  Nursing note and vitals reviewed.   ED Course  Procedures (including critical care time)  DIAGNOSTIC STUDIES: Oxygen Saturation is 98% on room air, normal by my interpretation.    COORDINATION OF CARE: 10:22 AM Discussed treatment plan with patient at beside, the patient agrees with the plan and has no further questions at this time.   Labs Review Labs Reviewed - No data to display  Imaging Review No results found.   EKG  Interpretation None      MDM   Final diagnoses:  Pharyngitis    28yM with sore throat. Pharyngitis w/o exudate. Afebrile. No nodes. Has cough.   Generally appears well. No significant concern for serious deep space neck infection. Plan symptomatic tx at this time. Rapid strep neg. Culture.   I personally preformed the services scribed in my presence. The recorded information has been reviewed is accurate. Raeford RazorStephen Zacherie Honeyman,  MD.   Raeford RazorStephen Rani Sisney, MD 10/01/14 [id number]

## 2014-09-28 NOTE — ED Notes (Signed)
Sore throat since yesterday.  Took Excedrin with no relief.  Had family members with stept throat.

## 2014-09-28 NOTE — Discharge Instructions (Signed)

## 2014-09-30 LAB — CULTURE, GROUP A STREP

## 2015-03-13 ENCOUNTER — Emergency Department (HOSPITAL_COMMUNITY)
Admission: EM | Admit: 2015-03-13 | Discharge: 2015-03-13 | Disposition: A | Discharge status: Home / Self Care | Payer: BC Managed Care – PPO | Attending: Emergency Medicine | Admitting: Emergency Medicine

## 2015-03-13 ENCOUNTER — Encounter (HOSPITAL_COMMUNITY): Payer: Self-pay | Admitting: Emergency Medicine

## 2015-03-13 DIAGNOSIS — G43801 Other migraine, not intractable, with status migrainosus: Secondary | ICD-10-CM | POA: Insufficient documentation

## 2015-03-13 MED ORDER — NAPROXEN 500 MG PO TABS: ORAL_TABLET | ORAL | Status: AC

## 2015-03-13 MED ORDER — DIPHENHYDRAMINE HCL 50 MG/ML IJ SOLN
25.0000 mg | Freq: Once | INTRAMUSCULAR | Status: AC
  Administered 2015-03-13: 25 mg via INTRAVENOUS
  Filled 2015-03-13: qty 1

## 2015-03-13 MED ORDER — DIPHENHYDRAMINE HCL 25 MG PO TABS: ORAL_TABLET | ORAL | Status: AC

## 2015-03-13 MED ORDER — KETOROLAC TROMETHAMINE 30 MG/ML IJ SOLN
30.0000 mg | Freq: Once | INTRAMUSCULAR | Status: AC
  Administered 2015-03-13: 30 mg via INTRAVENOUS
  Filled 2015-03-13: qty 1

## 2015-03-13 MED ORDER — METOCLOPRAMIDE HCL 5 MG/ML IJ SOLN
10.0000 mg | Freq: Once | INTRAMUSCULAR | Status: AC
  Administered 2015-03-13: 10 mg via INTRAVENOUS
  Filled 2015-03-13: qty 2

## 2015-03-13 MED ORDER — METOCLOPRAMIDE HCL 10 MG PO TABS: 10.0000 mg | ORAL_TABLET | Freq: Four times a day (QID) | ORAL | Status: AC | PRN

## 2015-03-13 MED ORDER — SODIUM CHLORIDE 0.9 % IV BOLUS (SEPSIS)
1000.0000 mL | Freq: Once | INTRAVENOUS | Status: AC
  Administered 2015-03-13: 1000 mL via INTRAVENOUS

## 2015-03-13 NOTE — Discharge Instructions (Signed)

## 2015-03-13 NOTE — ED Provider Notes (Signed)
CSN: [id number]     Arrival date & time 03/13/15  1826 History   First MD Initiated Contact with Patient 03/13/15 1832     Chief Complaint  Patient presents with  . Migraine      HPI  Patient resists evaluation of a right-sided throbbing headache. He reports a history of similar headaches in the past and been treated as migraines. Typically uses simple medicines at home. This headache started 2 days ago with nausea. Once her vomiting yesterday and persistent symptoms today. No neck pain. No fever. No neurological symptoms.  Past Medical History  Diagnosis Date  . Migraines    Past Surgical History  Procedure Laterality Date  . Tonsillectomy     No family history on file. Social History  Substance Use Topics  . Smoking status: Never Smoker   . Smokeless tobacco: None  . Alcohol Use: No    Review of Systems  Gastrointestinal: Positive for nausea and vomiting.  Neurological: Positive for headaches.      Allergies  Review of patient's allergies indicates no known allergies.  Home Medications   Prior to Admission medications   Medication Sig Start Date End Date Taking? Authorizing Provider  benzonatate (TESSALON) 100 MG capsule Take 1 capsule (100 mg total) by mouth every 8 (eight) hours. Patient not taking: Reported on 09/28/2014 06/28/14   Loren Racer, MD  diphenhydrAMINE (BENADRYL) 25 MG tablet 1 tablet by mouth as needed with naproxen and Reglan for migraine headache 03/13/15   Rolland Porter, MD  ibuprofen (ADVIL,MOTRIN) 600 MG tablet Take 1 tablet (600 mg total) by mouth every 6 (six) hours as needed. Patient not taking: Reported on 09/28/2014 06/28/14   Loren Racer, MD  metoCLOPramide (REGLAN) 10 MG tablet Take 1 tablet (10 mg total) by mouth every 6 (six) hours as needed (Headache. take with benadryl and Naprosyn). 03/13/15   Rolland Porter, MD  naproxen (NAPROSYN) 500 MG tablet 1 tablet as needed with Reglan and Benadryl for migraine headache 03/13/15   Rolland Porter, MD   traMADol (ULTRAM) 50 MG tablet Take 1 tablet (50 mg total) by mouth every 6 (six) hours as needed. 09/28/14   Raeford Razor, MD   BP 139/75 mmHg  Pulse 66  Temp(Src) 98.4 F (36.9 C)  Resp 18  Ht  (1.676 m)  Wt 222 lb (100.699 kg)  BMI 35.85 kg/m2  SpO2 100% Physical Exam  Constitutional: He is oriented to person, place, and time. He appears well-developed and well-nourished. No distress.  HENT:  Head: Normocephalic.  Eyes: Conjunctivae are normal. Pupils are equal, round, and reactive to light. No scleral icterus.  Neck: Normal range of motion. Neck supple. No thyromegaly present.  Cardiovascular: Normal rate and regular rhythm.  Exam reveals no gallop and no friction rub.   No murmur heard. Pulmonary/Chest: Effort normal and breath sounds normal. No respiratory distress. He has no wheezes. He has no rales.  Abdominal: Soft. Bowel sounds are normal. He exhibits no distension. There is no tenderness. There is no rebound.  Musculoskeletal: Normal range of motion.  Neurological: He is alert and oriented to person, place, and time.  Skin: Skin is warm and dry. No rash noted.  Psychiatric: He has a normal mood and affect. His behavior is normal.    ED Course  Procedures (including critical care time) Labs Review Labs Reviewed - No data to display  Imaging Review No results found. I have personally reviewed and evaluated these images and lab results as part of  my medical decision-making.   EKG Interpretation None      MDM   Final diagnoses:  Other migraine with status migrainosus, not intractable    Patient got significant relief with Benadryl, Toradol, Reglan, and IV fluids. His headache is improved but not completely resolved. He feels that it tolerable level now. Discharge with by mouth prescriptions for Reglan, Benadryl, naproxen.    Rolland Porter, MD 03/13/15 2011

## 2015-03-13 NOTE — ED Notes (Signed)
Migraine with n/v since yesterday.

## 2015-05-17 IMAGING — CR DG CHEST 2V
2 series · 2 of 2 positions shown · non-contrast
Comparison: 05/05/2007

CLINICAL DATA: Cough, rib pain

EXAM:
CHEST  2 VIEW

[view not recorded (1 of 2)]
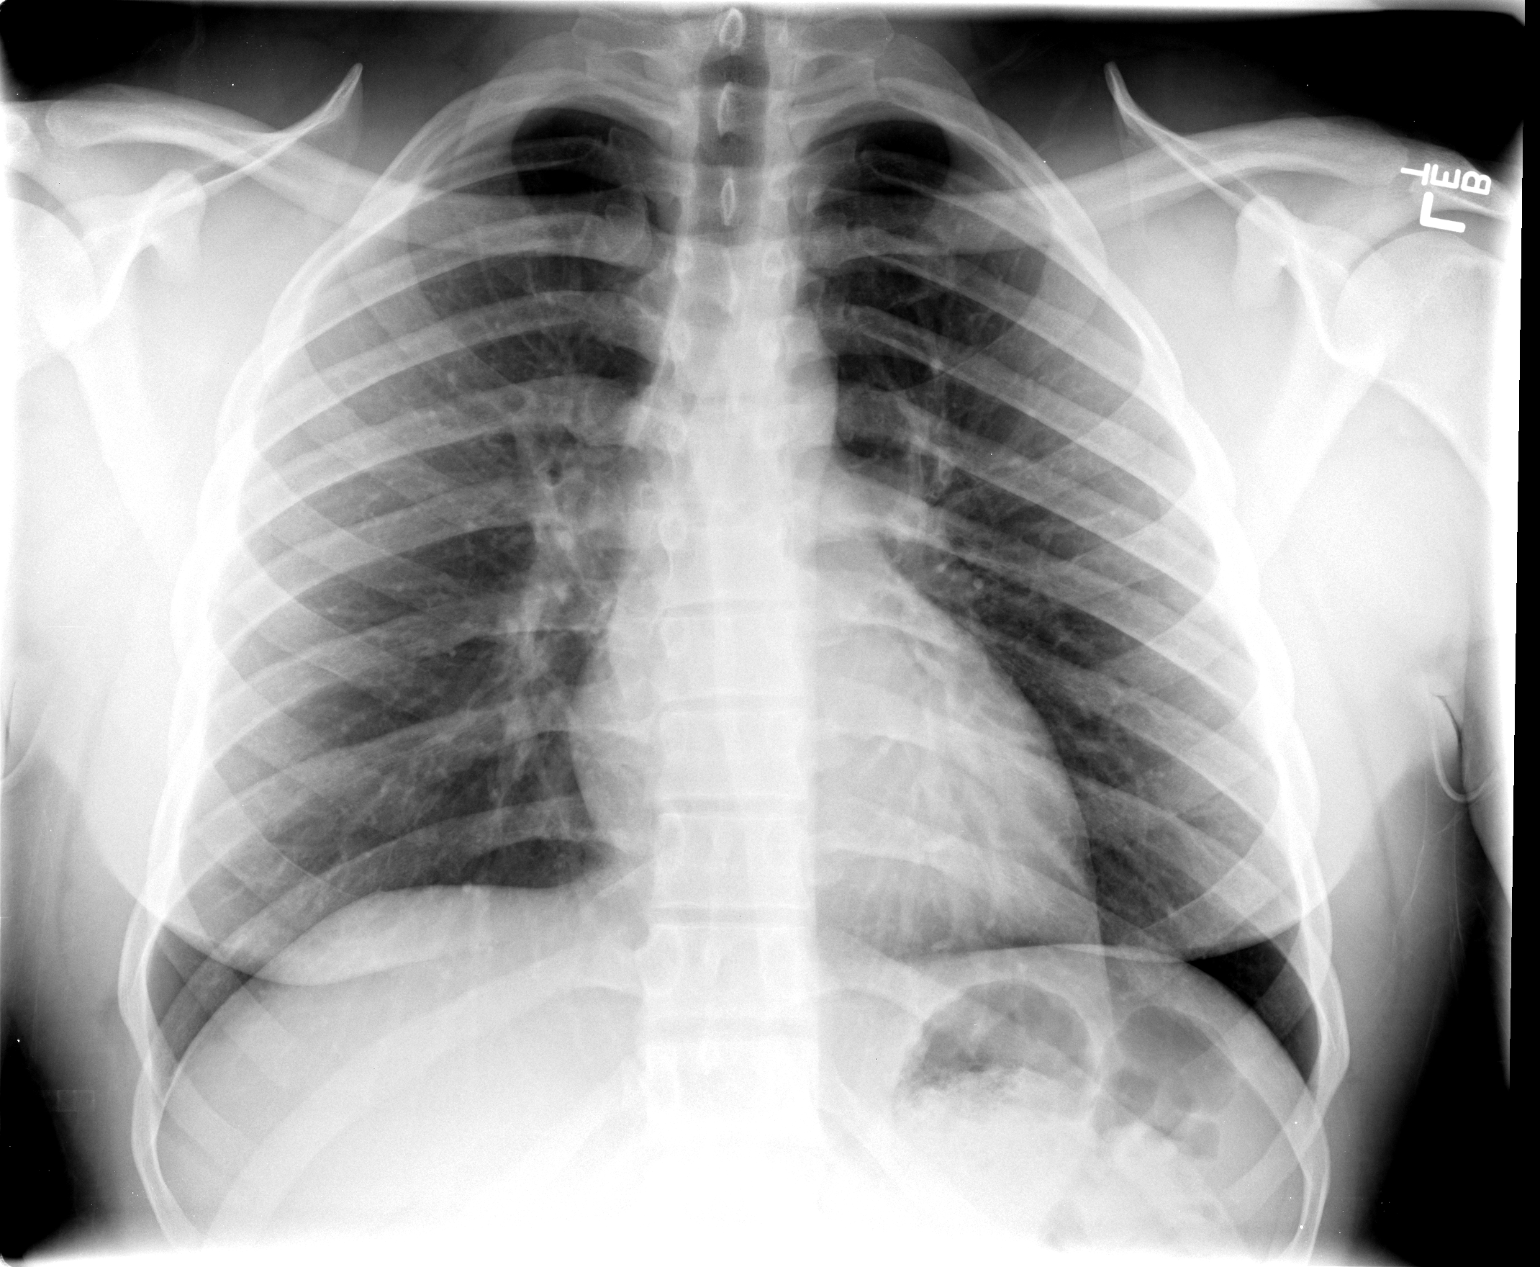

[view not recorded (2 of 2)]
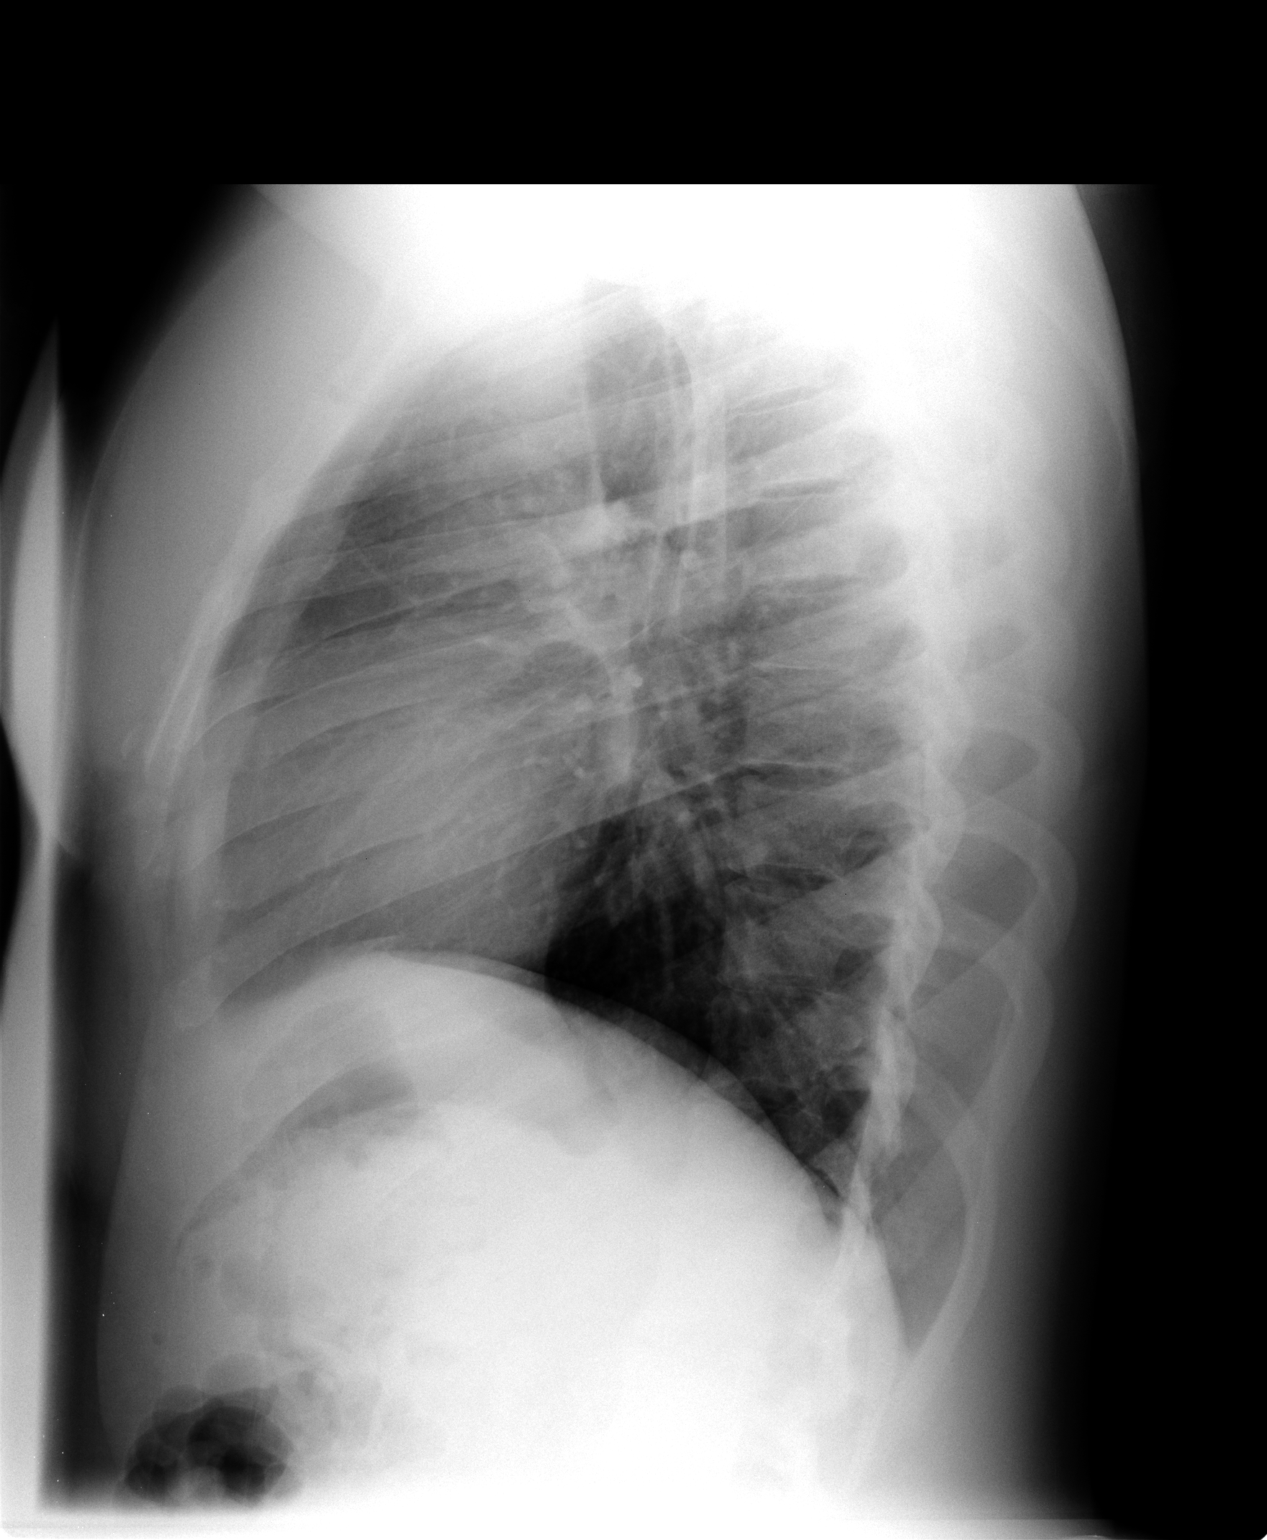

[2 of 2 positions shown; findings below may reference images not displayed]

FINDINGS: Normal mediastinum and cardiac silhouette. Normal pulmonary
vasculature. No evidence of effusion, infiltrate, or pneumothorax.
No acute bony abnormality.
IMPRESSION: Normal chest radiograph.

## 2017-11-02 ENCOUNTER — Emergency Department (HOSPITAL_COMMUNITY)
Admission: EM | Admit: 2017-11-02 | Discharge: 2017-11-02 | Disposition: A | Discharge status: Home / Self Care | Payer: Managed Care, Other (non HMO) | Attending: Emergency Medicine | Admitting: Emergency Medicine

## 2017-11-02 ENCOUNTER — Other Ambulatory Visit: Payer: Self-pay

## 2017-11-02 ENCOUNTER — Encounter (HOSPITAL_COMMUNITY): Payer: Self-pay | Admitting: Emergency Medicine

## 2017-11-02 DIAGNOSIS — W293XXA Contact with powered garden and outdoor hand tools and machinery, initial encounter: Secondary | ICD-10-CM | POA: Insufficient documentation

## 2017-11-02 DIAGNOSIS — S61212A Laceration without foreign body of right middle finger without damage to nail, initial encounter: Secondary | ICD-10-CM

## 2017-11-02 DIAGNOSIS — Y999 Unspecified external cause status: Secondary | ICD-10-CM | POA: Insufficient documentation

## 2017-11-02 DIAGNOSIS — Y929 Unspecified place or not applicable: Secondary | ICD-10-CM | POA: Diagnosis not present

## 2017-11-02 DIAGNOSIS — S61214A Laceration without foreign body of right ring finger without damage to nail, initial encounter: Secondary | ICD-10-CM

## 2017-11-02 DIAGNOSIS — Y93H2 Activity, gardening and landscaping: Secondary | ICD-10-CM | POA: Insufficient documentation

## 2017-11-02 NOTE — ED Triage Notes (Signed)
Pt reports using hedge trimmers yesterday and cut his ring and middle finger on R hand at approx 1900.

## 2017-11-02 NOTE — ED Provider Notes (Signed)
[facility] EMERGENCY DEPARTMENT Provider Note   CSN: [id number] Arrival date & time: 11/02/17  4098     History   Chief Complaint Chief Complaint  Patient presents with  . Finger Injury    HPI Clayton Heath is a 32 y.o. male.  HPI  Clayton Heath is a 32 y.o. male who presents to the Emergency Department complaining of lacerations to the tips of the right middle and ring fingers.  States he was using [employer] approximately 15 hours prior to arrival.  States that he cleaned the wounds with tap water and applied bandages, but wounds continued to ozz clear fluids.  Last td is up to date.  Complains of pain to palpation of the fingers.  He denies numbness, swelling or discoloration of his fingers.   Past Medical History:  Diagnosis Date  . Migraines     There are no active problems to display for this patient.   Past Surgical History:  Procedure Laterality Date  . TONSILLECTOMY       Home Medications    Prior to Admission medications   Medication Sig Start Date End Date Taking? Authorizing Provider  benzonatate (TESSALON) 100 MG capsule Take 1 capsule (100 mg total) by mouth every 8 (eight) hours. Patient not taking: Reported on 09/28/2014 06/28/14   Loren Racer, MD  diphenhydrAMINE (BENADRYL) 25 MG tablet 1 tablet by mouth as needed with naproxen and Reglan for migraine headache 03/13/15   Rolland Porter, MD  ibuprofen (ADVIL,MOTRIN) 600 MG tablet Take 1 tablet (600 mg total) by mouth every 6 (six) hours as needed. Patient not taking: Reported on 09/28/2014 06/28/14   Loren Racer, MD  metoCLOPramide (REGLAN) 10 MG tablet Take 1 tablet (10 mg total) by mouth every 6 (six) hours as needed (Headache. take with benadryl and Naprosyn). 03/13/15   Rolland Porter, MD  naproxen (NAPROSYN) 500 MG tablet 1 tablet as needed with Reglan and Benadryl for migraine headache 03/13/15   Rolland Porter, MD  traMADol (ULTRAM) 50 MG tablet Take 1 tablet (50 mg total) by  mouth every 6 (six) hours as needed. 09/28/14   Raeford Razor, MD    Family History No family history on file.  Social History Social History   Tobacco Use  . Smoking status: Never Smoker  . Smokeless tobacco: Never Used  Substance Use Topics  . Alcohol use: No  . Drug use: No     Allergies   Patient has no known allergies.   Review of Systems Review of Systems  Constitutional: Negative for chills and fever.  Musculoskeletal: Negative for arthralgias, back pain and joint swelling.  Skin: Positive for wound.       Laceration to right middle and ring fingers  Neurological: Negative for dizziness, weakness and numbness.  Hematological: Does not bruise/bleed easily.  All other systems reviewed and are negative.    Physical Exam Updated Vital Signs BP 122/87   Pulse 71   Temp 98 F (36.7 C)   Resp 18   SpO2 97%   Physical Exam  Constitutional: He is oriented to person, place, and time. He appears well-developed and well-nourished. No distress.  HENT:  Head: Normocephalic and atraumatic.  Cardiovascular: Normal rate, regular rhythm and intact distal pulses.  No murmur heard. Pulmonary/Chest: Effort normal and breath sounds normal. No respiratory distress.  Musculoskeletal: Normal range of motion. He exhibits no edema.       Right hand: He exhibits tenderness and laceration. He exhibits no bony  tenderness, normal two-point discrimination, normal capillary refill, no deformity and no swelling. Normal sensation noted. Normal strength noted. He exhibits no finger abduction.       Hands: Neurological: He is alert and oriented to person, place, and time. No sensory deficit. He exhibits normal muscle tone. Coordination normal.  Skin: Skin is warm. Capillary refill takes less than 2 seconds. Laceration noted.  1 cm Lacerations to the distal tips of the right middle and ring fingers.  No FBs, edema, or erythema.  Pt has full ROM of the fingers.  Psychiatric: He has a normal  mood and affect.  Nursing note and vitals reviewed.    ED Treatments / Results  Labs (all labs ordered are listed, but only abnormal results are displayed) Labs Reviewed - No data to display  EKG None  Radiology No results found.  Procedures Procedures (including critical care time)  Medications Ordered in ED Medications - No data to display   Initial Impression / Assessment and Plan / ED Course  I have reviewed the triage vital signs and the nursing notes.  Pertinent labs & imaging results that were available during my care of the patient were reviewed by me and considered in my medical decision making (see chart for details).     Pt with superficial lacerations to distal finger tips.  Lacs are greater that 12 hours.  I do not feel that suture closure is appropriate due to increased risk of infection.  Pt agrees to plan.  Wounds were cleaned and steri-strip applied by nursing.  Wounds bandaged.  Wound care and wound infection precautions discussed    Final Clinical Impressions(s) / ED Diagnoses   Final diagnoses:  Laceration of right middle finger without foreign body without damage to nail, initial encounter  Laceration of right ring finger without foreign body without damage to nail, initial encounter    ED Discharge Orders    None       Rosey Bath 11/02/17 2332    Donnetta Hutching, MD 11/03/17 0730

## 2017-11-02 NOTE — Discharge Instructions (Addendum)
Clean the wounds with mild soap and water.  Keep them bandaged to avoid contamination.  You may take over-the-counter Aleve or Advil as directed if needed for pain.  Return to the ER for any worsening symptoms or signs of infection such as redness, increasing pain, swelling, red streaks or drainage.

## 2019-04-25 ENCOUNTER — Encounter (HOSPITAL_COMMUNITY): Payer: Self-pay | Admitting: Emergency Medicine

## 2019-04-25 ENCOUNTER — Emergency Department (HOSPITAL_COMMUNITY)
Admission: EM | Admit: 2019-04-25 | Discharge: 2019-04-25 | Disposition: A | Discharge status: Home / Self Care | Payer: Managed Care, Other (non HMO) | Attending: Emergency Medicine | Admitting: Emergency Medicine

## 2019-04-25 ENCOUNTER — Emergency Department (HOSPITAL_COMMUNITY): Payer: Managed Care, Other (non HMO)

## 2019-04-25 ENCOUNTER — Other Ambulatory Visit: Payer: Self-pay

## 2019-04-25 DIAGNOSIS — Z23 Encounter for immunization: Secondary | ICD-10-CM | POA: Insufficient documentation

## 2019-04-25 DIAGNOSIS — S90851A Superficial foreign body, right foot, initial encounter: Secondary | ICD-10-CM | POA: Insufficient documentation

## 2019-04-25 DIAGNOSIS — Y999 Unspecified external cause status: Secondary | ICD-10-CM | POA: Insufficient documentation

## 2019-04-25 DIAGNOSIS — W458XXA Other foreign body or object entering through skin, initial encounter: Secondary | ICD-10-CM | POA: Insufficient documentation

## 2019-04-25 DIAGNOSIS — Z1889 Other specified retained foreign body fragments: Secondary | ICD-10-CM | POA: Insufficient documentation

## 2019-04-25 DIAGNOSIS — Y92009 Unspecified place in unspecified non-institutional (private) residence as the place of occurrence of the external cause: Secondary | ICD-10-CM | POA: Insufficient documentation

## 2019-04-25 DIAGNOSIS — Y9301 Activity, walking, marching and hiking: Secondary | ICD-10-CM | POA: Insufficient documentation

## 2019-04-25 MED ORDER — LIDOCAINE-EPINEPHRINE (PF) 2 %-1:200000 IJ SOLN
10.0000 mL | Freq: Once | INTRAMUSCULAR | Status: DC
  Filled 2019-04-25: qty 20

## 2019-04-25 MED ORDER — TETANUS-DIPHTH-ACELL PERTUSSIS 5-2.5-18.5 LF-MCG/0.5 IM SUSP
0.5000 mL | Freq: Once | INTRAMUSCULAR | Status: AC
  Administered 2019-04-25: 0.5 mL via INTRAMUSCULAR
  Filled 2019-04-25: qty 0.5

## 2019-04-25 NOTE — ED Triage Notes (Signed)
Pt c/o pain to R heel, pt states while playing on carpet with child he believes a sewing needle became lodged. Pt reports his mother had dropped needles in that area.

## 2019-04-25 NOTE — ED Provider Notes (Signed)
[facility] EMERGENCY DEPARTMENT Provider Note   CSN: [id number] Arrival date & time: 04/25/19  2029     History   Chief Complaint Chief Complaint  Patient presents with  . Foreign Body    HPI KALEO CONDREY is a 33 y.o. male.     Patient to ED after sudden onset pain in the right heel while walking barefoot at home. He suspects he stepped on a needle that his mother might have dropped in the area. No other injury. Last tetanus 2012.  The history is provided by the patient. No language interpreter was used.    Past Medical History:  Diagnosis Date  . Migraines     There are no active problems to display for this patient.   Past Surgical History:  Procedure Laterality Date  . TONSILLECTOMY          Home Medications    Prior to Admission medications   Medication Sig Start Date End Date Taking? Authorizing Provider  benzonatate (TESSALON) 100 MG capsule Take 1 capsule (100 mg total) by mouth every 8 (eight) hours. Patient not taking: Reported on 09/28/2014 06/28/14   Loren Racer, MD  diphenhydrAMINE (BENADRYL) 25 MG tablet 1 tablet by mouth as needed with naproxen and Reglan for migraine headache 03/13/15   Rolland Porter, MD  ibuprofen (ADVIL,MOTRIN) 600 MG tablet Take 1 tablet (600 mg total) by mouth every 6 (six) hours as needed. Patient not taking: Reported on 09/28/2014 06/28/14   Loren Racer, MD  metoCLOPramide (REGLAN) 10 MG tablet Take 1 tablet (10 mg total) by mouth every 6 (six) hours as needed (Headache. take with benadryl and Naprosyn). 03/13/15   Rolland Porter, MD  naproxen (NAPROSYN) 500 MG tablet 1 tablet as needed with Reglan and Benadryl for migraine headache 03/13/15   Rolland Porter, MD  traMADol (ULTRAM) 50 MG tablet Take 1 tablet (50 mg total) by mouth every 6 (six) hours as needed. 09/28/14   Raeford Razor, MD    Family History No family history on file.  Social History Social History   Tobacco Use  . Smoking status:  Never Smoker  . Smokeless tobacco: Never Used  Substance Use Topics  . Alcohol use: No  . Drug use: No     Allergies   Patient has no known allergies.   Review of Systems Review of Systems  Musculoskeletal:       See HPI.  Skin:       FB in skin     Physical Exam Updated Vital Signs BP 124/79 (BP Location: Left Arm)   Pulse 74   Temp 98.4 F (36.9 C) (Oral)   Resp 16   Ht 5\' 6"  (1.676 m)   Wt 105 kg   SpO2 98%   BMI 37.36 kg/m   Physical Exam Constitutional:      Appearance: He is well-developed.  Neck:     Musculoskeletal: Normal range of motion.  Pulmonary:     Effort: Pulmonary effort is normal.  Musculoskeletal: Normal range of motion.  Skin:    General: Skin is warm and dry.     Comments: Pinpoint metallic FB visulized in the posterior right heel. No bleeding. No significant swelling.   Neurological:     Mental Status: He is alert and oriented to person, place, and time.      ED Treatments / Results  Labs (all labs ordered are listed, but only abnormal results are displayed) Labs Reviewed - No data to display  EKG None  Radiology Dg Foot Complete Right  Result Date: 04/25/2019 CLINICAL DATA:  Foreign body in heel EXAM: RIGHT FOOT COMPLETE - 3+ VIEW COMPARISON:  None. FINDINGS: There is a 7 mm metallic needle tip seen on the plantar surface of the heel in the subcutaneous tissues. Overlying soft tissue swelling is seen. No fracture seen. IMPRESSION: 7 mm metallic needle tip within the subcutaneous tissues overlying the posterior calcaneus. Electronically Signed   By: Prudencio Pair M.D.   On: 04/25/2019 22:44    Procedures .Foreign Body Removal  Date/Time: 04/25/2019 11:06 PM Performed by: Charlann Lange, PA-C Authorized by: Charlann Lange, PA-C  Consent: Verbal consent obtained. Consent given by: patient Patient understanding: patient states understanding of the procedure being performed Patient identity confirmed: verbally with patient  Time out: Immediately prior to procedure a "time out" was called to verify the correct patient, procedure, equipment, support staff and site/side marked as required. Body area: skin General location: lower extremity Location details: right foot Anesthesia: local infiltration  Anesthesia: Local Anesthetic: lidocaine 2% with epinephrine Anesthetic total: 0.5 mL  Sedation: Patient sedated: no  Patient restrained: no Tendon involvement: none Complexity: simple 1 (1 cm needle tip) objects recovered. Post-procedure assessment: foreign body removed   (including critical care time)  Medications Ordered in ED Medications  lidocaine-EPINEPHrine (XYLOCAINE W/EPI) 2 %-1:200000 (PF) injection 10 mL (has no administration in time range)  Tdap (BOOSTRIX) injection 0.5 mL (has no administration in time range)     Initial Impression / Assessment and Plan / ED Course  I have reviewed the triage vital signs and the nursing notes.  Pertinent labs & imaging results that were available during my care of the patient were reviewed by me and considered in my medical decision making (see chart for details).        Patient to ED with needle in right heel removed without complication or difficulty. Tetanus updated.  Final Clinical Impressions(s) / ED Diagnoses   Final diagnoses:  None   1. FB right foot  ED Discharge Orders    None       Charlann Lange, PA-C 04/25/19 2307    Blanchie Dessert, MD 04/26/19 1525

## 2019-04-25 NOTE — ED Notes (Signed)
Patient transported to X-ray 

## 2020-03-14 IMAGING — DX DG FOOT COMPLETE 3+V*R*
3 series · 3 of 3 positions shown · non-contrast
Comparison: None.

CLINICAL DATA: Foreign body in heel

EXAM:
RIGHT FOOT COMPLETE - 3+ VIEW

[foot ap]
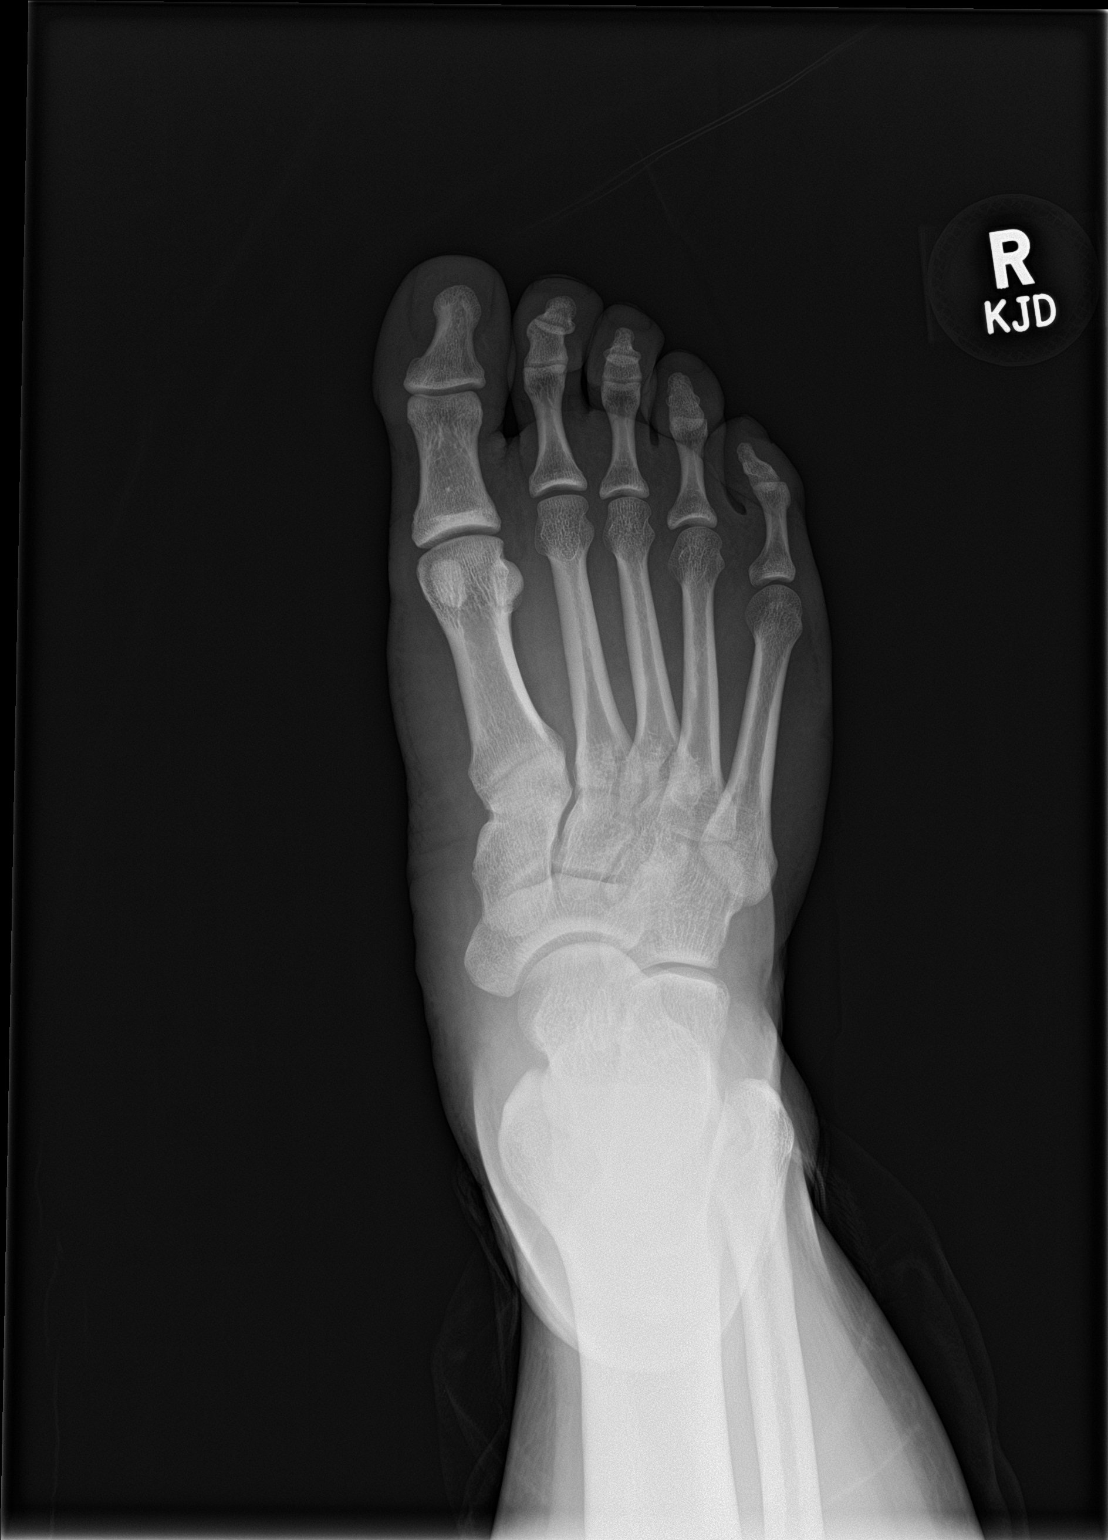

[foot obl]
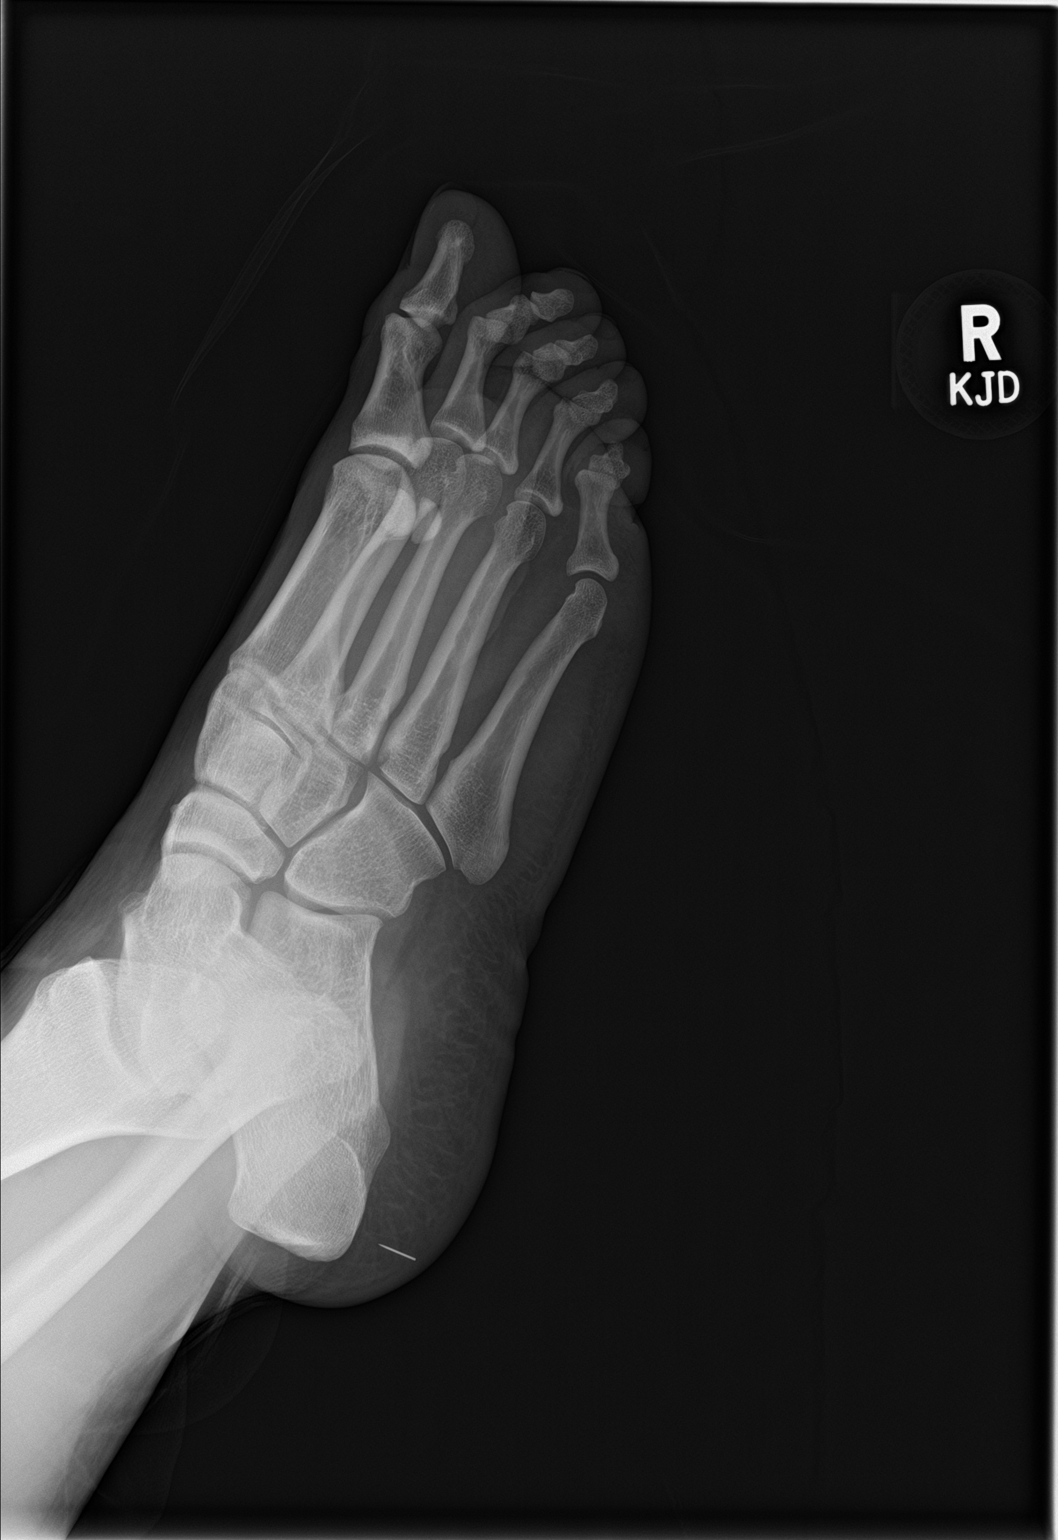

[foot lat]
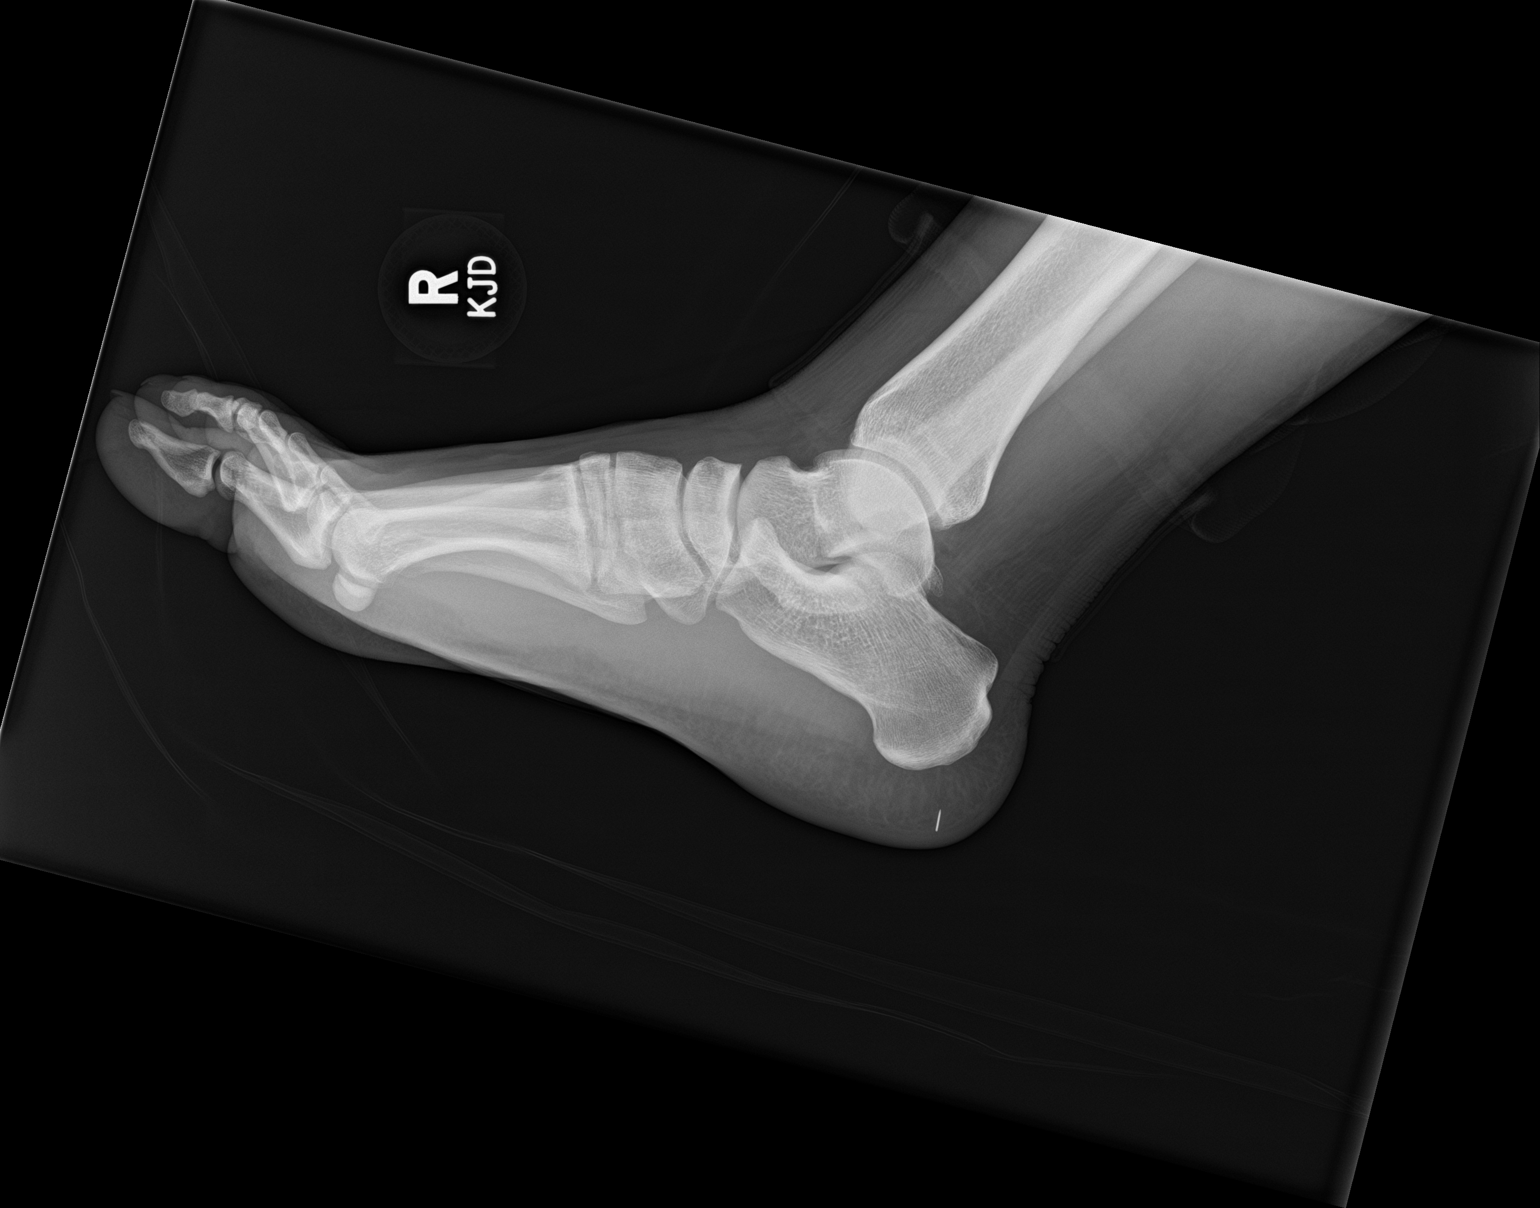

[3 of 3 positions shown; findings below may reference images not displayed]

FINDINGS: There is a 7 mm metallic needle tip seen on the plantar surface of
the heel in the subcutaneous tissues. Overlying soft tissue swelling
is seen. No fracture seen.
IMPRESSION: 7 mm metallic needle tip within the subcutaneous tissues overlying
the posterior calcaneus.

## 2020-07-01 ENCOUNTER — Other Ambulatory Visit: Payer: BC Managed Care – PPO

## 2020-07-01 DIAGNOSIS — Z20822 Contact with and (suspected) exposure to covid-19: Secondary | ICD-10-CM

## 2020-07-04 LAB — NOVEL CORONAVIRUS, NAA: SARS-CoV-2, NAA: NOT DETECTED

## 2024-04-12 ENCOUNTER — Encounter (HOSPITAL_COMMUNITY): Payer: Self-pay | Admitting: Pharmacy Technician

## 2024-04-12 ENCOUNTER — Other Ambulatory Visit: Payer: Self-pay

## 2024-04-12 ENCOUNTER — Emergency Department (HOSPITAL_COMMUNITY)
Admission: EM | Admit: 2024-04-12 | Discharge: 2024-04-12 | Disposition: A | Discharge status: Home / Self Care | Payer: MEDICAID | Attending: Emergency Medicine | Admitting: Emergency Medicine

## 2024-04-12 DIAGNOSIS — G43909 Migraine, unspecified, not intractable, without status migrainosus: Secondary | ICD-10-CM | POA: Insufficient documentation

## 2024-04-12 DIAGNOSIS — R519 Headache, unspecified: Secondary | ICD-10-CM

## 2024-04-12 LAB — BASIC METABOLIC PANEL WITH GFR
Anion gap: 10 (ref 5–15)
BUN: 13 mg/dL (ref 6–20)
CO2: 22 mmol/L (ref 22–32)
Calcium: 9 mg/dL (ref 8.9–10.3)
Chloride: 104 mmol/L (ref 98–111)
Creatinine, Ser: 1.08 mg/dL (ref 0.61–1.24)
GFR, Estimated: >60 mL/min (ref >60)
Glucose, Bld: 96 mg/dL (ref 70–99)
Potassium: 4.3 mmol/L (ref 3.5–5.1)
Sodium: 136 mmol/L (ref 135–145)

## 2024-04-12 LAB — CBC
HCT: 45.1 % (ref 39.0–52.0)
Hemoglobin: 15.8 g/dL (ref 13.0–17.0)
MCH: 30.4 pg (ref 26.0–34.0)
MCHC: 35 g/dL (ref 30.0–36.0)
MCV: 86.9 fL (ref 80.0–100.0)
Platelets: 216 K/uL (ref 150–400)
RBC: 5.19 MIL/uL (ref 4.22–5.81)
RDW: 12.2 % (ref 11.5–15.5)
WBC: 4.4 K/uL (ref 4.0–10.5)
nRBC: 0 % (ref 0.0–0.2)

## 2024-04-12 MED ORDER — SODIUM CHLORIDE 0.9 % IV BOLUS
1000.0000 mL | Freq: Once | INTRAVENOUS | Status: AC
  Administered 2024-04-12: 1000 mL via INTRAVENOUS

## 2024-04-12 MED ORDER — DIPHENHYDRAMINE HCL 50 MG/ML IJ SOLN
50.0000 mg | Freq: Once | INTRAMUSCULAR | Status: AC
  Administered 2024-04-12: 50 mg via INTRAVENOUS
  Filled 2024-04-12: qty 1

## 2024-04-12 MED ORDER — KETOROLAC TROMETHAMINE 15 MG/ML IJ SOLN
15.0000 mg | Freq: Once | INTRAMUSCULAR | Status: AC
  Administered 2024-04-12: 15 mg via INTRAVENOUS
  Filled 2024-04-12: qty 1

## 2024-04-12 MED ORDER — PROCHLORPERAZINE MALEATE 10 MG PO TABS: 10.0000 mg | ORAL_TABLET | Freq: Two times a day (BID) | ORAL | 0 refills | Status: AC | PRN

## 2024-04-12 MED ORDER — PROCHLORPERAZINE EDISYLATE 10 MG/2ML IJ SOLN
10.0000 mg | Freq: Once | INTRAMUSCULAR | Status: AC
  Administered 2024-04-12: 10 mg via INTRAVENOUS
  Filled 2024-04-12: qty 2

## 2024-04-12 NOTE — ED Notes (Signed)
 Awaiting pt from lobby

## 2024-04-12 NOTE — ED Provider Notes (Signed)
 [place] EMERGENCY DEPARTMENT AT [facility] Provider Note   CSN: [id number] Arrival date & time: 04/12/24  0940     Patient presents with: Headache   Clayton Heath is a 38 y.o. male.   The history is provided by the patient and medical records. No language interpreter was used.  Headache Pain location:  Generalized Quality:  Dull Radiates to:  Does not radiate Severity currently:  9/10 Severity at highest:  9/10 Onset quality:  Gradual Duration:  5 days Timing:  Constant Progression:  Waxing and waning Chronicity:  Recurrent Similar to prior headaches: yes   Context: bright light   Relieved by:  Nothing Worsened by:  Light and sound Ineffective treatments:  None tried Associated symptoms: blurred vision, nausea, photophobia and vomiting   Associated symptoms: no abdominal pain, no back pain, no congestion, no cough, no diarrhea, no dizziness, no eye pain, no fatigue, no fever, no focal weakness, no loss of balance, no near-syncope, no neck pain, no neck stiffness, no numbness, no seizures and no weakness        Prior to Admission medications   Medication Sig Start Date End Date Taking? Authorizing Provider  benzonatate  (TESSALON ) 100 MG capsule Take 1 capsule (100 mg total) by mouth every 8 (eight) hours. Patient not taking: Reported on 09/28/2014 06/28/14   Carlyle Lenis, MD  diphenhydrAMINE  (BENADRYL ) 25 MG tablet 1 tablet by mouth as needed with naproxen  and Reglan  for migraine headache 03/13/15   Lynwood Anes, MD  ibuprofen  (ADVIL ,MOTRIN ) 600 MG tablet Take 1 tablet (600 mg total) by mouth every 6 (six) hours as needed. Patient not taking: Reported on 09/28/2014 06/28/14   Carlyle Lenis, MD  metoCLOPramide  (REGLAN ) 10 MG tablet Take 1 tablet (10 mg total) by mouth every 6 (six) hours as needed (Headache. take with benadryl  and Naprosyn ). 03/13/15   Lynwood Anes, MD  naproxen  (NAPROSYN ) 500 MG tablet 1 tablet as needed with Reglan  and Benadryl  for  migraine headache 03/13/15   Lynwood Anes, MD  traMADol  (ULTRAM ) 50 MG tablet Take 1 tablet (50 mg total) by mouth every 6 (six) hours as needed. 09/28/14   Loetta Senior, MD    Allergies: Patient has no known allergies.    Review of Systems  Constitutional:  Negative for chills, fatigue and fever.  HENT:  Negative for congestion.   Eyes:  Positive for blurred vision and photophobia. Negative for pain.  Respiratory:  Negative for cough, chest tightness, shortness of breath and wheezing.   Cardiovascular:  Negative for near-syncope.  Gastrointestinal:  Positive for nausea and vomiting. Negative for abdominal pain, constipation and diarrhea.  Genitourinary:  Negative for dysuria.  Musculoskeletal:  Negative for back pain, neck pain and neck stiffness.  Skin:  Negative for rash and wound.  Neurological:  Positive for headaches. Negative for dizziness, focal weakness, seizures, weakness, light-headedness, numbness and loss of balance.  Psychiatric/Behavioral:  Negative for agitation and confusion.   All other systems reviewed and are negative.   Updated Vital Signs BP (!) 138/93 (BP Location: Right Arm)   Pulse 68   Temp 98.2 F (36.8 C)   Resp 16   SpO2 100%   Physical Exam Vitals and nursing note reviewed.  Constitutional:      General: He is not in acute distress.    Appearance: He is well-developed. He is not ill-appearing, toxic-appearing or diaphoretic.  HENT:     Head: Normocephalic and atraumatic.     Nose: No congestion or rhinorrhea.  Mouth/Throat:     Mouth: Mucous membranes are moist.     Pharynx: No oropharyngeal exudate.  Eyes:     Extraocular Movements: Extraocular movements intact.     Conjunctiva/sclera: Conjunctivae normal.     Pupils: Pupils are equal, round, and reactive to light.  Cardiovascular:     Rate and Rhythm: Normal rate and regular rhythm.     Heart sounds: No murmur heard. Pulmonary:     Effort: Pulmonary effort is normal. No respiratory  distress.     Breath sounds: Normal breath sounds. No wheezing, rhonchi or rales.  Chest:     Chest wall: No tenderness.  Abdominal:     General: Abdomen is flat.     Palpations: Abdomen is soft.     Tenderness: There is no abdominal tenderness.  Musculoskeletal:        General: No swelling or tenderness.     Cervical back: Neck supple. No tenderness.  Skin:    General: Skin is warm and dry.     Capillary Refill: Capillary refill takes less than 2 seconds.     Findings: No erythema or rash.  Neurological:     General: No focal deficit present.     Mental Status: He is alert.     Sensory: No sensory deficit.     Motor: No weakness.  Psychiatric:        Mood and Affect: Mood normal.     (all labs ordered are listed, but only abnormal results are displayed) Labs Reviewed  BASIC METABOLIC PANEL WITH GFR  CBC    EKG: None  Radiology: No results found.   Procedures   Medications Ordered in the ED  prochlorperazine (COMPAZINE) injection 10 mg (10 mg Intravenous Given 04/12/24 1227)  diphenhydrAMINE  (BENADRYL ) injection 50 mg (50 mg Intravenous Given 04/12/24 1227)  ketorolac  (TORADOL ) 15 MG/ML injection 15 mg (15 mg Intravenous Given 04/12/24 1227)  sodium chloride  0.9 % bolus 1,000 mL (0 mLs Intravenous Stopped 04/12/24 1328)                                    Medical Decision Making Amount and/or Complexity of Data Reviewed Labs: ordered.  Risk Prescription drug management.    Clayton Heath is a 38 y.o. male with a past medical history significant for migraines who presents with headache.  Patient reports that for the last few days he has had headache that is 9 of 10 severity.  He reports it feels similar to previous migraines but has not been this bad in quite some time.  Denies any trauma.  Denies fevers chills or cough.  Reports some slight vision changes that wax and wane with his headaches that he has had before.  He denies any constipation, diarrhea, or  urinary changes but he had some nausea and vomiting with it.  He has severe photophobia and phonophobia.  Denies any neck pain or neck stiffness.  Denies any trauma.  Denies any chest pain, shortness of breath or cough.  Denies any extremity symptoms such as numbness or weakness.  He reports this feels like his previous migraines but he is not getting better at home with home medications.  On exam, lungs clear.  Chest nontender.  Abdomen nontender.  Pupils symmetric and reactive with normal extraocular movements.  Symmetric smile.  Clear speech.  Normal finger-nose-finger testing.  Normal sensation strength and pulse in extremities.  Patient otherwise  well-appearing but has the lights off in the room because of the photophobia.  We had a shared decision-making conversation about management.  Given his lack of trauma or other concerning red flags at this time, we will hold on head imaging and patient agrees.  This is similar to previous headaches he has had in the past.  He had some screening blood work in triage that was reassuring.  Will give a headache cocktail and reassess, dissipate discharge if he is feeling better after medications and fluids.  2:44 PM Patient's labs completely reassuring.  His headache drastically improved with headache cocktail and he feels he would like to go home.  He will be given the number for outpatient neurology and agreed with plan of care.  He understood return precautions and follow-up instructions and was discharged in good condition and we agreed to hold on or extensive workup today.      Final diagnoses:  Bad headache  Migraine without status migrainosus, not intractable, unspecified migraine type    ED Discharge Orders          Ordered    prochlorperazine (COMPAZINE) 10 MG tablet  2 times daily PRN        04/12/24 1445            Clinical Impression: 1. Bad headache   2. Migraine without status migrainosus, not intractable, unspecified  migraine type     Disposition: Discharge  Condition: Good  I have discussed the results, Dx and Tx plan with the pt(& family if present). He/she/they expressed understanding and agree(s) with the plan. Discharge instructions discussed at great length. Strict return precautions discussed and pt &/or family have verbalized understanding of the instructions. No further questions at time of discharge.    New Prescriptions   PROCHLORPERAZINE (COMPAZINE) 10 MG TABLET    Take 1 tablet (10 mg total) by mouth 2 (two) times daily as needed for nausea or vomiting.    Follow Up: [facility] [address]     [address] [place] [place]  [address] [phone]       Riyah Bardon, Lonni PARAS, MD 04/12/24 1446

## 2024-04-12 NOTE — ED Triage Notes (Signed)
 Pt here with reports of headache onset Tuesday or Wednesday. Headache worsened last night. Also with ringing in ears. Hx migraine when he was a kid.

## 2024-04-12 NOTE — Discharge Instructions (Signed)
 Your history, exam, and evaluation today seem consistent with recurrent and worsened migrainous type headache that improved drastically with medications.  Your labs were reassuring as was your exam.  We agreed to hold on more extensive workup or imaging today as your symptoms were improving.  Please rest and stay hydrated and consider using the Compazine medication to help with nausea and headache.  If it makes you anxious or jittery you can use Benadryl  as well like we did here.  Please consider follow-up with outpatient neurology if symptoms recur or if symptoms change or worsen drastically, return to the nearest emergency department.
